# Patient Record
Sex: Female | Born: 1965 | Race: Black or African American | Hispanic: No | Marital: Single | Smoking: Never smoker
Health system: Southern US, Community
[De-identification: ages and names within clinical notes are randomized; demographics above are authoritative.]

## PROBLEM LIST (undated history)

## (undated) DIAGNOSIS — E119 Type 2 diabetes mellitus without complications: Secondary | ICD-10-CM

## (undated) DIAGNOSIS — G4730 Sleep apnea, unspecified: Secondary | ICD-10-CM

## (undated) DIAGNOSIS — J45909 Unspecified asthma, uncomplicated: Secondary | ICD-10-CM

## (undated) DIAGNOSIS — I10 Essential (primary) hypertension: Secondary | ICD-10-CM

## (undated) HISTORY — PX: PATELLA FRACTURE SURGERY: SHX735

## (undated) HISTORY — PX: OOPHORECTOMY: SHX86

## (undated) HISTORY — PX: ABDOMINAL HYSTERECTOMY: SHX81

## (undated) HISTORY — PX: KNEE ARTHROSCOPY: SUR90

---

## 2004-03-25 ENCOUNTER — Other Ambulatory Visit: Payer: Self-pay

## 2004-07-23 ENCOUNTER — Ambulatory Visit: Payer: Self-pay

## 2006-11-04 ENCOUNTER — Ambulatory Visit: Payer: Self-pay

## 2007-05-21 ENCOUNTER — Ambulatory Visit: Payer: Self-pay | Admitting: Cardiovascular Disease

## 2007-05-22 ENCOUNTER — Emergency Department: Payer: Self-pay | Admitting: Emergency Medicine

## 2007-07-18 ENCOUNTER — Emergency Department: Payer: Self-pay | Admitting: Emergency Medicine

## 2008-02-24 ENCOUNTER — Emergency Department: Payer: Self-pay | Admitting: Emergency Medicine

## 2008-03-09 ENCOUNTER — Emergency Department: Payer: Self-pay | Admitting: Emergency Medicine

## 2008-06-07 ENCOUNTER — Ambulatory Visit: Payer: Self-pay | Admitting: Obstetrics and Gynecology

## 2008-07-05 ENCOUNTER — Ambulatory Visit: Payer: Self-pay | Admitting: Obstetrics and Gynecology

## 2008-07-18 ENCOUNTER — Inpatient Hospital Stay: Payer: Self-pay | Admitting: Obstetrics and Gynecology

## 2008-07-22 ENCOUNTER — Emergency Department: Payer: Self-pay | Admitting: Emergency Medicine

## 2008-12-23 ENCOUNTER — Emergency Department: Payer: Self-pay | Admitting: Emergency Medicine

## 2009-06-27 ENCOUNTER — Emergency Department: Payer: Self-pay | Admitting: Unknown Physician Specialty

## 2009-09-01 ENCOUNTER — Inpatient Hospital Stay: Payer: Self-pay | Admitting: Student

## 2009-09-19 ENCOUNTER — Inpatient Hospital Stay: Payer: Self-pay | Admitting: Internal Medicine

## 2009-09-28 ENCOUNTER — Inpatient Hospital Stay: Payer: Self-pay | Admitting: Internal Medicine

## 2009-10-30 ENCOUNTER — Ambulatory Visit: Payer: Self-pay | Admitting: Specialist

## 2009-11-10 ENCOUNTER — Ambulatory Visit: Payer: Self-pay | Admitting: Specialist

## 2009-12-11 ENCOUNTER — Inpatient Hospital Stay: Payer: Self-pay | Admitting: Internal Medicine

## 2010-01-05 ENCOUNTER — Emergency Department: Payer: Self-pay | Admitting: Emergency Medicine

## 2010-03-28 ENCOUNTER — Emergency Department: Payer: Self-pay | Admitting: Emergency Medicine

## 2010-06-21 ENCOUNTER — Ambulatory Visit: Payer: Self-pay | Admitting: Orthopedic Surgery

## 2010-07-30 ENCOUNTER — Ambulatory Visit: Payer: Self-pay | Admitting: Orthopedic Surgery

## 2010-08-07 ENCOUNTER — Ambulatory Visit: Payer: Self-pay | Admitting: Orthopedic Surgery

## 2011-03-28 ENCOUNTER — Ambulatory Visit: Payer: Self-pay | Admitting: Family Medicine

## 2011-06-20 ENCOUNTER — Emergency Department: Payer: Self-pay | Admitting: *Deleted

## 2011-06-28 ENCOUNTER — Emergency Department: Payer: Self-pay | Admitting: Emergency Medicine

## 2011-10-14 IMAGING — CR DG CHEST 1V PORT
1 series · 1 of 1 positions shown · non-contrast
Comparison: none

REASON FOR EXAM: Chest Pain
COMMENTS:

PROCEDURE:     DXR - DXR PORTABLE CHEST SINGLE VIEW  - December 10, 2009 [DATE]
RESULT:     There are patchy pulmonary infiltrative changes bilaterally.
Pneumonia would be the first consideration as to etiology. No pleural
effusion is seen. The heart size is within normal limits.

[view not recorded]
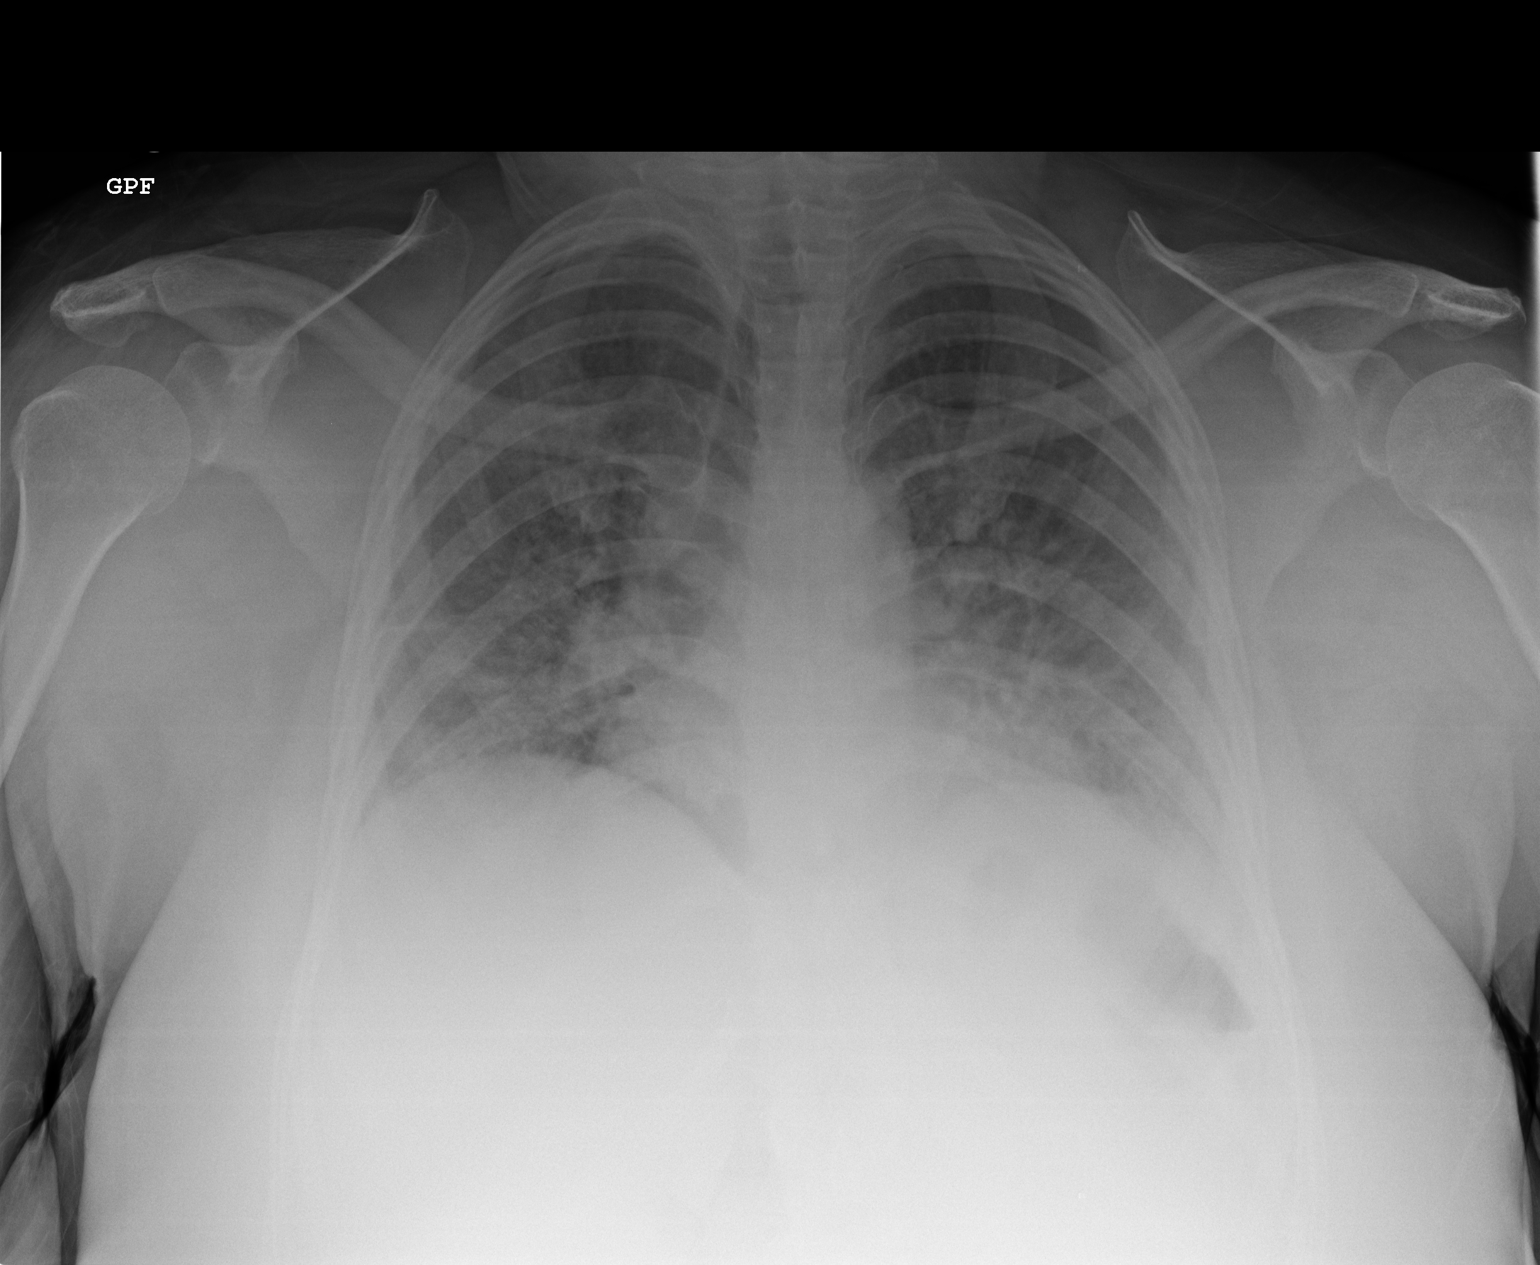

[1 of 1 positions shown; findings below may reference images not displayed]

IMPRESSION: There are patchy interstitial and alveolar infiltrative
changes bilaterally. Pneumonia would be the first consideration although
edema could produce similar findings. Continued follow-up is recommended.

## 2011-10-17 IMAGING — US US THYROID
1 series · 14 of 14 positions shown · non-contrast
Comparison: none

REASON FOR EXAM: goiter, nodule on ct
COMMENTS:

[Series 1: us thyroid · 14 of 14 slices shown]
[im 1/14]
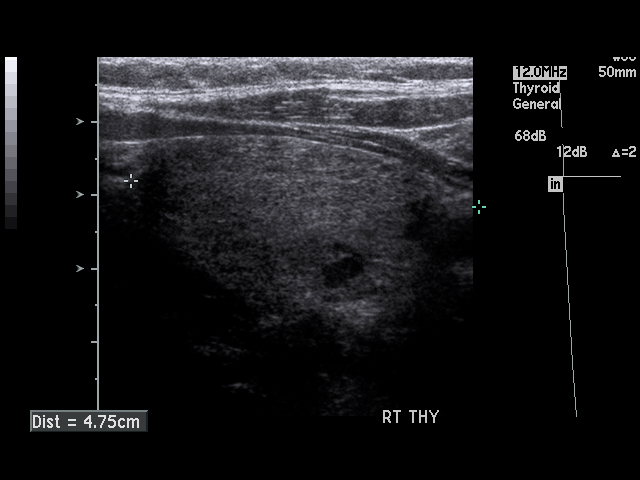
[im 2/14]
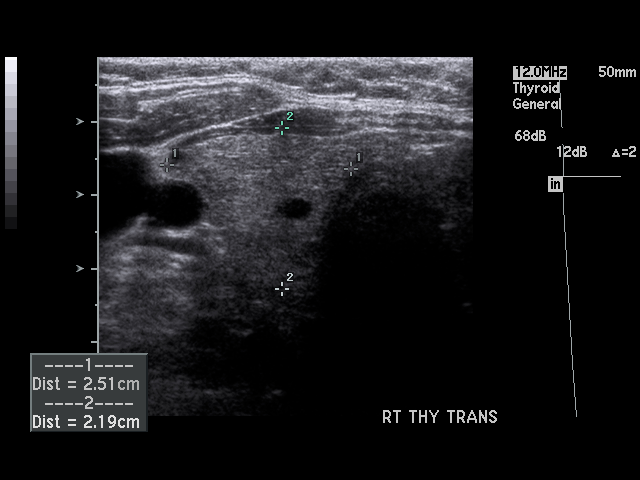
[im 3/14]
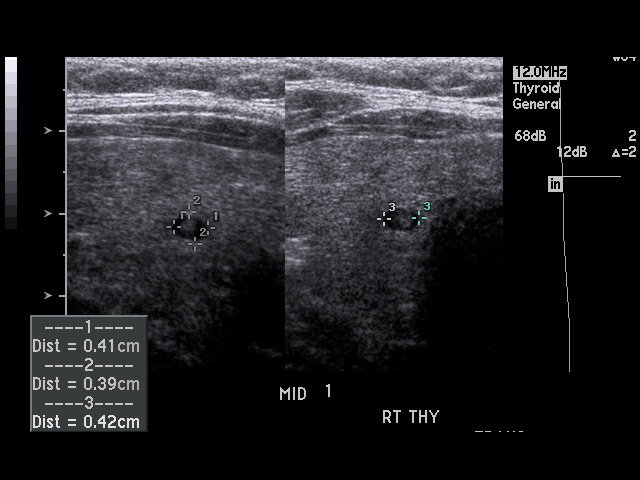
[im 4/14]
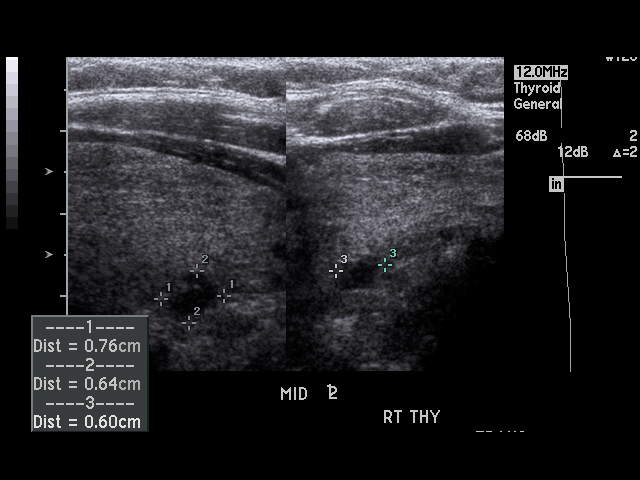
[im 5/14]
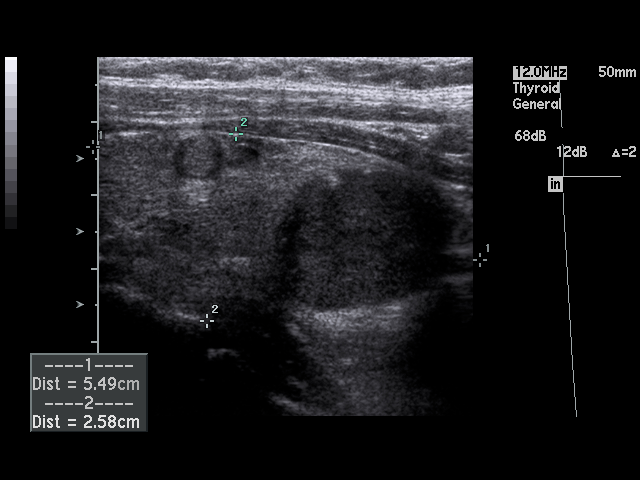
[im 6/14]
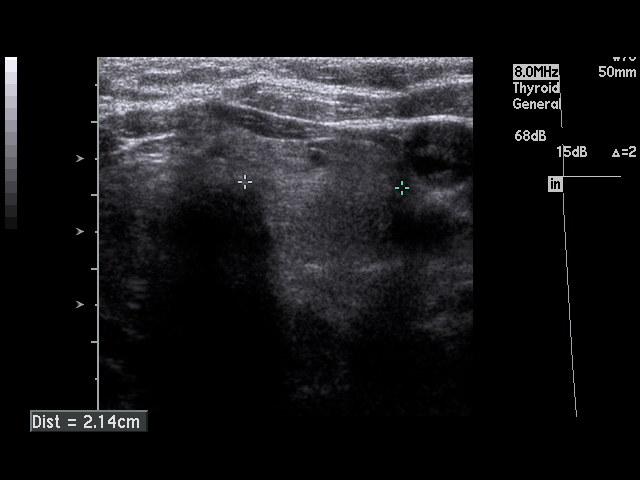
[im 7/14]
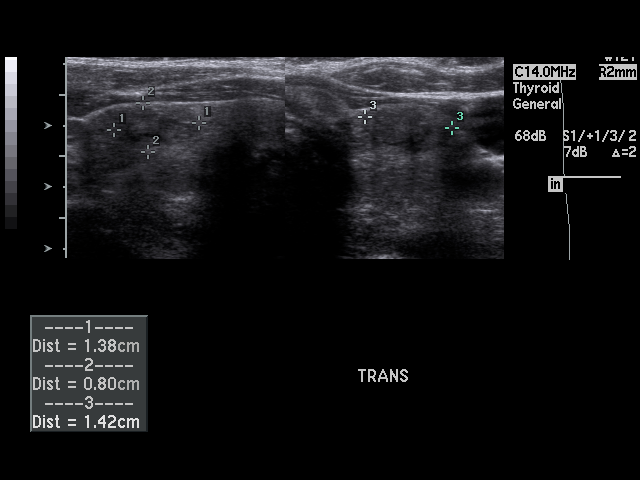
[im 8/14]
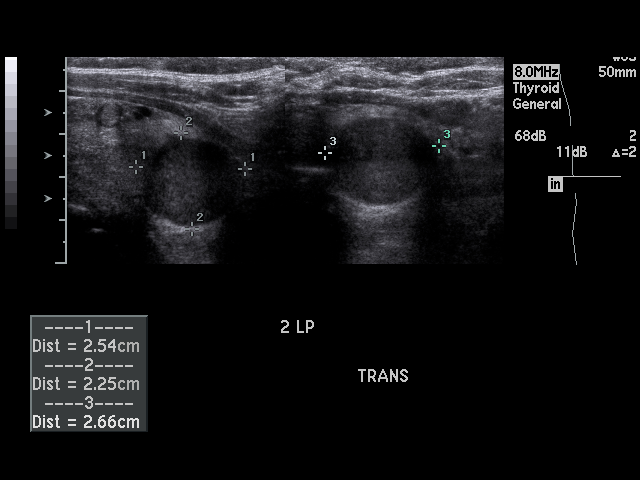
[im 9/14]
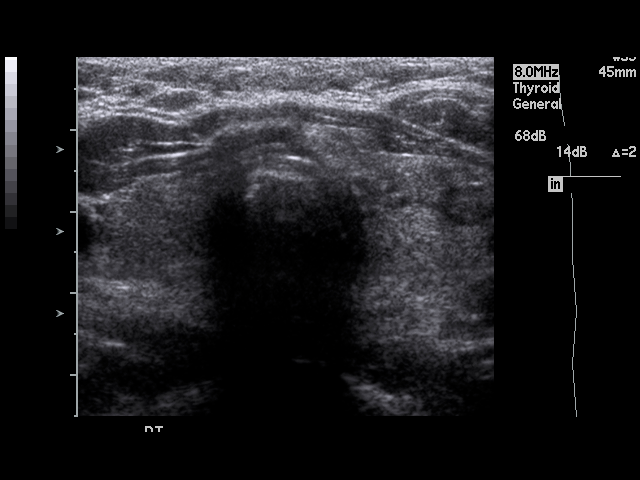
[im 10/14]
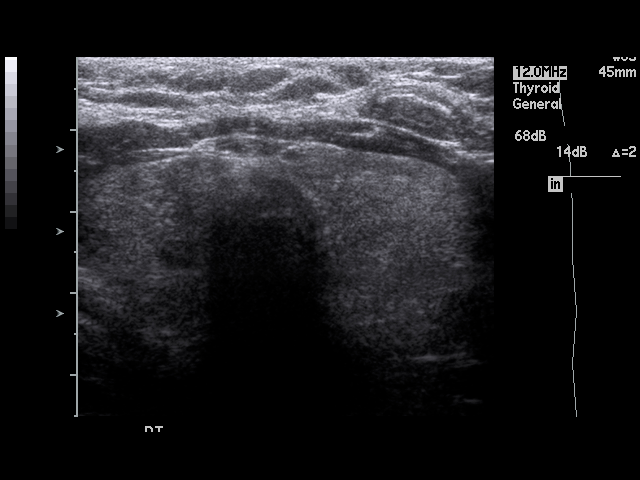
[im 11/14]
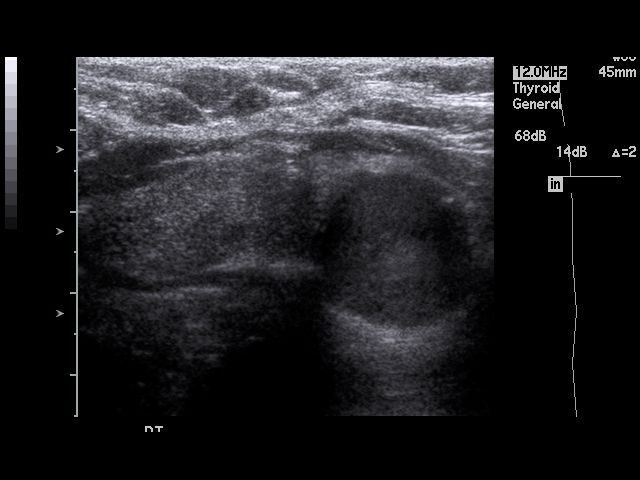
[im 12/14]
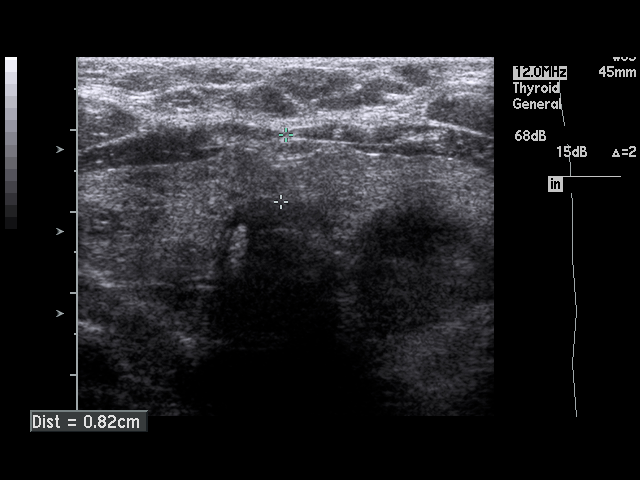
[im 13/14]
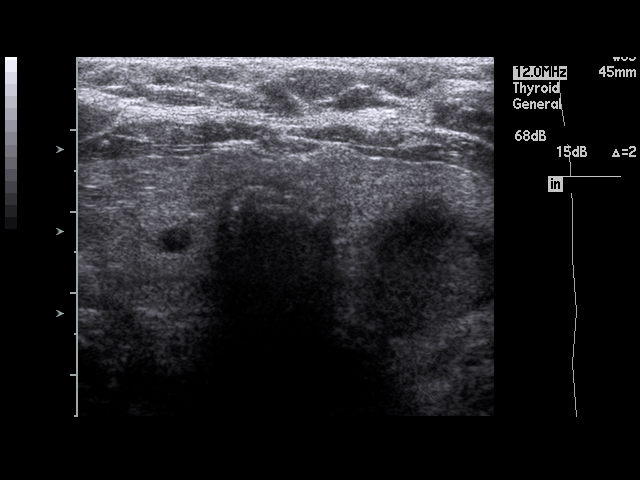
[im 14/14]
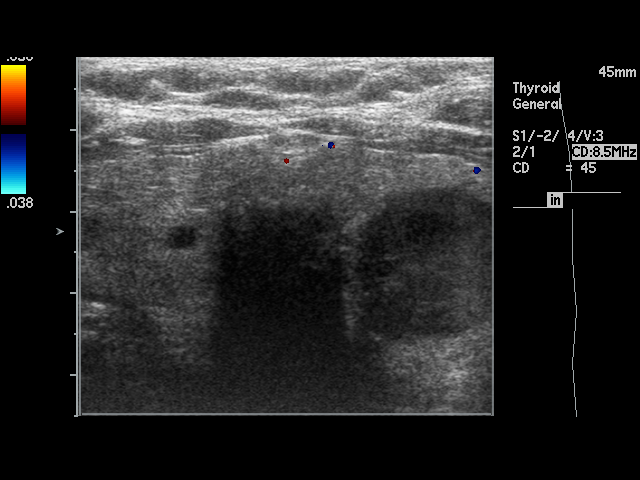

[14 of 14 positions shown; findings below may reference images not displayed]

PROCEDURE:     US  - US THYROID  - December 13, 2009  [DATE]

RESULT:     The right lobe of the thyroid measures 4.75 cm x 2.5 cm x
cm and the left lobe measures 5.49 cm x 2.1 cm x 2.58 cm. In the right lobe,
there are noted two hypoechoic nodules with the smaller being at the lower
pole and measuring 7.6 mm at maximum diameter and the larger being in the
midpole and measuring 4.2 mm at maximum diameter. On the left, there is a
1.42 cm hypoechoic nodule at the upper pole and a 2.66 cm hypoechoic nodule
at the lower pole. No calcifications are seen in association with the
nodules or in the thyroid lobes. The thyroid echotexture is mildly
heterogeneous bilaterally.
IMPRESSION: Multinodular thyroid consistent with multinodular goiter.

## 2013-04-20 ENCOUNTER — Emergency Department: Payer: Self-pay | Admitting: Emergency Medicine

## 2013-04-30 LAB — CBC WITH DIFFERENTIAL/PLATELET
Basophil #: 0.1 10*3/uL (ref 0.0–0.1)
HCT: 40.4 % (ref 35.0–47.0)
HGB: 13.6 g/dL (ref 12.0–16.0)
Lymphocyte %: 45.3 %
MCH: 27.8 pg (ref 26.0–34.0)
MCHC: 33.7 g/dL (ref 32.0–36.0)
Neutrophil #: 4.9 10*3/uL (ref 1.4–6.5)
Neutrophil %: 37.7 %
RBC: 4.9 10*6/uL (ref 3.80–5.20)
RDW: 13.6 % (ref 11.5–14.5)
WBC: 13.1 10*3/uL — ABNORMAL HIGH (ref 3.6–11.0)

## 2013-04-30 LAB — BASIC METABOLIC PANEL
Calcium, Total: 9.3 mg/dL (ref 8.5–10.1)
Co2: 29 mmol/L (ref 21–32)
Creatinine: 0.48 mg/dL — ABNORMAL LOW (ref 0.60–1.30)
EGFR (African American): >60
Glucose: 172 mg/dL — ABNORMAL HIGH (ref 65–99)
Potassium: 3.3 mmol/L — ABNORMAL LOW (ref 3.5–5.1)

## 2013-05-01 ENCOUNTER — Observation Stay: Payer: Self-pay | Admitting: Internal Medicine

## 2013-05-01 LAB — POTASSIUM: Potassium: 4.2 mmol/L (ref 3.5–5.1)

## 2013-05-05 LAB — CULTURE, BLOOD (SINGLE)

## 2013-11-04 ENCOUNTER — Emergency Department: Payer: Self-pay | Admitting: Emergency Medicine

## 2013-12-16 ENCOUNTER — Emergency Department: Payer: Self-pay | Admitting: Emergency Medicine

## 2013-12-16 LAB — COMPREHENSIVE METABOLIC PANEL
ALK PHOS: 117 U/L
ALT: 19 U/L (ref 12–78)
Albumin: 3 g/dL — ABNORMAL LOW (ref 3.4–5.0)
Anion Gap: 6 — ABNORMAL LOW (ref 7–16)
BUN: 8 mg/dL (ref 7–18)
Bilirubin,Total: 0.2 mg/dL (ref 0.2–1.0)
Calcium, Total: 8.4 mg/dL — ABNORMAL LOW (ref 8.5–10.1)
Chloride: 100 mmol/L (ref 98–107)
Co2: 28 mmol/L (ref 21–32)
Creatinine: 0.58 mg/dL — ABNORMAL LOW (ref 0.60–1.30)
EGFR (African American): >60
Glucose: 336 mg/dL — ABNORMAL HIGH (ref 65–99)
Osmolality: 280 (ref 275–301)
Potassium: 3.6 mmol/L (ref 3.5–5.1)
SGOT(AST): 13 U/L — ABNORMAL LOW (ref 15–37)
SODIUM: 134 mmol/L — AB (ref 136–145)
TOTAL PROTEIN: 7.3 g/dL (ref 6.4–8.2)

## 2013-12-16 LAB — CBC
HCT: 43.1 % (ref 35.0–47.0)
HGB: 14.1 g/dL (ref 12.0–16.0)
MCH: 28 pg (ref 26.0–34.0)
MCHC: 32.7 g/dL (ref 32.0–36.0)
MCV: 86 fL (ref 80–100)
Platelet: 240 10*3/uL (ref 150–440)
RBC: 5.04 10*6/uL (ref 3.80–5.20)
RDW: 13.5 % (ref 11.5–14.5)
WBC: 8.6 10*3/uL (ref 3.6–11.0)

## 2013-12-16 LAB — TROPONIN I

## 2013-12-16 LAB — PRO B NATRIURETIC PEPTIDE: B-Type Natriuretic Peptide: 11 pg/mL (ref 0–125)

## 2013-12-16 LAB — LIPASE, BLOOD: LIPASE: 88 U/L (ref 73–393)

## 2014-12-09 NOTE — H&P (Signed)
PATIENT NAME:  Cynthia Howe, Cynthia Howe MR#:  [id number] DATE OF BIRTH:  [birth year]  DATE OF ADMISSION:  04/30/2013  REFERRING PHYSICIAN:  Dr. Fanny Bien.  PRIMARY CARE PHYSICIAN:  Dr. Angus Palms.  HISTORY OF PRESENT ILLNESS:  This is a 49 year old African American female with history of diabetes, hypertension, pulmonary hypertension, as well as asthma.  She is a nonsmoker.  She is presenting with shortness of breath and cough that has been progressive over the last three weeks and worsening today to the point where she said she could not catch her breath.  She has been without her Symbicort medication for approximately two weeks in total secondary to insurance issues.  Once again, she has had shortness of breath, described as difficulty getting air with associated wheezing, productive cough of clear sputum.  All of these symptoms have been present for a total of three weeks, though worsening to the point of Emergency Department presentation today.  She was actually seen on September 3rd at [facility] Emergency Department for similar symptoms.  She was given Augmentin at that time without any resolution of her symptoms, so she presents once again today.  She denies any fevers or chills.  In the Emergency Department she was given steroids, azithromycin and ceftriaxone as well as breathing treatments with improvement of her symptoms.  Currently she is without complaint.   REVIEW OF SYSTEMS: CONSTITUTIONAL:  Denies any fevers, fatigue.  EYES:  Denies any blurred vision or eye pain.  EARS, NOSE, THROAT:  Denies any hearing loss or dysphagia, postnasal drip.  RESPIRATORY:  Cough as above, wheeze as above, shortness of breath as above.  CARDIOVASCULAR:  Denies chest pain, palpitations.  GASTROINTESTINAL:  Denies nausea, vomiting, diarrhea.  GENITOURINARY:  Denies dysuria, hematuria.  ENDOCRINE:  Denies nocturia or thyroid problems.  HEMATOLOGIC AND LYMPHATIC:  Denies any anemia or easy bruising.  SKIN:   Denies rash or lesion.  MUSCULOSKELETAL:  Denies neck or back pain.   NEUROLOGIC:  Denies any paralysis or paresthesias.  PSYCHIATRIC:  Denies anxiety or depressive symptoms.   PAST MEDICAL HISTORY:  Hypertension, pulmonary hypertension, type 2 diabetes which is insulin requiring, as well as asthma.   SOCIAL HISTORY:  She is a nonsmoker.  Denies alcohol or drug usage.  She works at [employer]. where she has occupational exposures of dust and fumes which worsened her symptoms of shortness of breath, cough and wheezing.   FAMILY HISTORY:  Significant for diabetes as well as hypertension.   ALLERGIES:  CIPRO AND SEPTRA.   HOME MEDICATIONS:  Include albuterol 90 mcg inhalation 2 puffs 4 times daily as needed, Norvasc 5 mg by mouth daily, aspirin 81 mg by mouth daily, glipizide 10 mg by mouth daily, Lantus 20 units subcutaneous injection daily, lisinopril 40 mg by mouth daily, NovoLog 4 units subcutaneous three times daily, Symbicort 160/4.5 mcg inhalation 2 puffs 2 times daily.   PHYSICAL EXAMINATION: VITAL SIGNS:  Temperature 98.4, heart rate 98, respirations 24, blood pressure 140/67, saturating 94% on room air, BMI 37.6.  GENERAL:  No acute distress, awake, and oriented x 3.  HEENT:  Normocephalic, atraumatic.  Extraocular muscles intact.  Pupils equal, round, reactive to light as well as accommodation.  Moist mucosal membranes.  CARDIOVASCULAR:  S1 and S2, regular rate and rhythm.  No murmurs, rubs or gallops.  PULMONARY:  Clear to auscultation bilaterally with decreased breath sounds throughout.  No wheeze rhonchi or crackles.  ABDOMEN:  Soft, nontender, nondistended, obese, positive bowel sounds.  EXTREMITIES:  Reveal no cyanosis, edema or clubbing.  NEUROLOGIC:  Cranial nerves II through XII intact.  No gross focal neurological deficit.   LABORATORY DATA:  Sodium 137, potassium 3.3, chloride 101, bicarb 29, BUN 9, creatinine 0.48, glucose 172.  WBC 13.1, which is lymphocytic in nature 5.9  as well as increased eosinophils of 1.5, hemoglobin 13.6, platelets 263.  Chest x-ray, no acute cardiopulmonary process.  EKG normal sinus rhythm, heart rate 86.   ASSESSMENT AND PLAN:  This is a 49 year old African American female with history of hypertension, diabetes, pulmonary hypertension as well as asthma.  She is a nonsmoker presenting with three week duration of shortness of breath and cough productive of clear sputum, which is minimal.  Her symptoms have been progressively worsening over the last three weeks to the point today where she stated she could not catch her breath.  She is of note out of Symbicort for approximately two weeks secondary to insurance issues.   1.  Asthma exacerbation.  She does not carry a diagnosis of chronic obstructive pulmonary disease.  She is a nonsmoker.  She does however have occupational exposures and likely reactive airways.  She has had some improvement in her symptoms, but is still decreased air entry.  Continue albuterol treatments scheduled q. 4 hours while awake as well as steroids.  She has no indications for antibiotics at this time. 2.  Type 2 diabetes, insulin requiring, continue Lantus, NovoLog, add insulin sliding scale.  Hold by mouth agents.  3.  Hypertension, Norvasc and lisinopril.  4.  Pulmonary hypertension.  She will need an outpatient sleep study if not previously performed.  5.  She is FULL CODE.   Time:  33 minutes.    ____________________________ Cletis Athensavid K. Hower, MD dkh:ea D: 05/01/2013 00:30:05 ET T: 05/01/2013 01:08:15 ET JOB#: [id number]  cc: Cletis Athensavid K. Hower, MD, <Dictator> DAVID Synetta ShadowK HOWER MD ELECTRONICALLY SIGNED 05/01/2013 21:18

## 2014-12-09 NOTE — Discharge Summary (Signed)
PATIENT NAME:  Cynthia PereyraHOLMAN, Amariah D MR#:  [id number] DATE OF BIRTH:  [birth year]  DATE OF ADMISSION:  05/01/2013 DATE OF DISCHARGE:  05/01/2013  PRIMARY CARE PHYSICIAN:  Dr. Angus PalmsGeorge Sionne.  DISCHARGE DIAGNOSES:  1.  Asthma exacerbation. 2.  Hypertension.  3.  Diabetes.  4.  Pulmonary hypertension.  5.  Obesity.   CODE STATUS:  FULL CODE.   CONDITION:  Stable.   HOME MEDICATIONS: 1.  Glipizide 10 mg by mouth daily.  2.  Norvasc 5 mg by mouth daily.  3.  Aspirin 81 mg by mouth daily.  4.  Lisinopril 40 mg by mouth daily. 5.  Lantus 20 units subQ once a day.  6.  NovoLog 4 units subQ three times daily.  7.  Symbicort 160 mcg/4.5 mcg inhalation aerosol 2 puffs twice daily.  8.  Albuterol CFC free 90 mcg inhalation aerosol 2 puffs 4 times a day as needed for shortness of breath.  9.  Guaifenesin 5 mL every four hours as needed for cough.   DIET:  Low-sodium, low-fat, low-cholesterol ADA diet.   ACTIVITY:  As tolerated.   FOLLOW-UP CARE:  Follow up with PCP within 1 to 2 weeks.  The patient also [facility]   REASON FOR ADMISSION:  Shortness of breath, cough, 3 weeks, worsening one day.   HOSPITAL COURSE:   1.  A 49 year old [place] female with a history of hypertension, diabetes, pulmonary hypertension and asthma that presented to the ED with cough and sputum for three weeks, worsening one day.  The patient could not get Symbicort due to insurance authorization.  For a detailed history and physical examination, please refer to the admission note dictated by Dr. Clint GuyHower.  On admission date, the patient's laboratory data is the following:  Sodium 137, potassium 3.3, chloride 101, bicarb 29, BUN 9, creatinine 0.48, WBC 13.1.  The patient was admitted for an asthma exacerbation.  After admission, the patient has been treated with Symbicort and albuterol.  In addition, the patient was treated with cough medication.  The  patient's symptoms has much improved after admission, she has only mild cough without shortness of breath.  She was given Symbicort going home.  2.  For diabetes, the patient has been treated with sliding scale and Lantus.  Blood sugar has been stable.    The patient is clinically stable, will be discharged home today.  I discussed the patient's discharge plan with the patient and nurse.   TIME SPENT:  About 32 minutes.    ____________________________ Shaune PollackQing Elesha Thedford, MD qc:ea D: 05/01/2013 14:12:13 ET T: 05/01/2013 19:16:38 ET JOB#: [id number]  cc: Shaune PollackQing Verina Galeno, MD, <Dictator> Shaune PollackQING Celise Bazar MD ELECTRONICALLY SIGNED 05/02/2013 15:36

## 2015-01-30 ENCOUNTER — Encounter: Payer: Self-pay | Admitting: Emergency Medicine

## 2015-01-30 ENCOUNTER — Emergency Department: Payer: 59

## 2015-01-30 ENCOUNTER — Observation Stay
Admission: EM | Admit: 2015-01-30 | Discharge: 2015-02-01 | Disposition: A | Discharge status: Home / Self Care | Payer: 59 | Attending: Internal Medicine | Admitting: Internal Medicine

## 2015-01-30 DIAGNOSIS — Z794 Long term (current) use of insulin: Secondary | ICD-10-CM | POA: Diagnosis not present

## 2015-01-30 DIAGNOSIS — Z9989 Dependence on other enabling machines and devices: Secondary | ICD-10-CM

## 2015-01-30 DIAGNOSIS — R0602 Shortness of breath: Secondary | ICD-10-CM | POA: Diagnosis not present

## 2015-01-30 DIAGNOSIS — R0902 Hypoxemia: Secondary | ICD-10-CM | POA: Insufficient documentation

## 2015-01-30 DIAGNOSIS — Z881 Allergy status to other antibiotic agents status: Secondary | ICD-10-CM | POA: Diagnosis not present

## 2015-01-30 DIAGNOSIS — Z79899 Other long term (current) drug therapy: Secondary | ICD-10-CM | POA: Insufficient documentation

## 2015-01-30 DIAGNOSIS — Z7951 Long term (current) use of inhaled steroids: Secondary | ICD-10-CM | POA: Diagnosis not present

## 2015-01-30 DIAGNOSIS — J9600 Acute respiratory failure, unspecified whether with hypoxia or hypercapnia: Secondary | ICD-10-CM | POA: Diagnosis not present

## 2015-01-30 DIAGNOSIS — Z8249 Family history of ischemic heart disease and other diseases of the circulatory system: Secondary | ICD-10-CM | POA: Diagnosis not present

## 2015-01-30 DIAGNOSIS — G4733 Obstructive sleep apnea (adult) (pediatric): Secondary | ICD-10-CM | POA: Diagnosis present

## 2015-01-30 DIAGNOSIS — R51 Headache: Secondary | ICD-10-CM | POA: Diagnosis not present

## 2015-01-30 DIAGNOSIS — E119 Type 2 diabetes mellitus without complications: Secondary | ICD-10-CM

## 2015-01-30 DIAGNOSIS — J4541 Moderate persistent asthma with (acute) exacerbation: Secondary | ICD-10-CM | POA: Diagnosis not present

## 2015-01-30 DIAGNOSIS — R079 Chest pain, unspecified: Secondary | ICD-10-CM | POA: Diagnosis not present

## 2015-01-30 DIAGNOSIS — E785 Hyperlipidemia, unspecified: Secondary | ICD-10-CM | POA: Diagnosis not present

## 2015-01-30 DIAGNOSIS — K5900 Constipation, unspecified: Secondary | ICD-10-CM

## 2015-01-30 DIAGNOSIS — T380X5A Adverse effect of glucocorticoids and synthetic analogues, initial encounter: Secondary | ICD-10-CM | POA: Insufficient documentation

## 2015-01-30 DIAGNOSIS — R519 Headache, unspecified: Secondary | ICD-10-CM

## 2015-01-30 DIAGNOSIS — J9601 Acute respiratory failure with hypoxia: Secondary | ICD-10-CM

## 2015-01-30 DIAGNOSIS — Z833 Family history of diabetes mellitus: Secondary | ICD-10-CM | POA: Insufficient documentation

## 2015-01-30 DIAGNOSIS — I272 Other secondary pulmonary hypertension: Secondary | ICD-10-CM | POA: Insufficient documentation

## 2015-01-30 DIAGNOSIS — I10 Essential (primary) hypertension: Secondary | ICD-10-CM | POA: Diagnosis not present

## 2015-01-30 DIAGNOSIS — Z809 Family history of malignant neoplasm, unspecified: Secondary | ICD-10-CM | POA: Diagnosis not present

## 2015-01-30 DIAGNOSIS — J45901 Unspecified asthma with (acute) exacerbation: Secondary | ICD-10-CM | POA: Diagnosis present

## 2015-01-30 HISTORY — DX: Unspecified asthma, uncomplicated: J45.909

## 2015-01-30 HISTORY — DX: Sleep apnea, unspecified: G47.30

## 2015-01-30 HISTORY — DX: Type 2 diabetes mellitus without complications: E11.9

## 2015-01-30 HISTORY — DX: Essential (primary) hypertension: I10

## 2015-01-30 LAB — CBC
HCT: 40.4 % (ref 35.0–47.0)
HEMOGLOBIN: 13.1 g/dL (ref 12.0–16.0)
MCH: 26.6 pg (ref 26.0–34.0)
MCHC: 32.4 g/dL (ref 32.0–36.0)
MCV: 82.3 fL (ref 80.0–100.0)
Platelets: 303 10*3/uL (ref 150–440)
RBC: 4.91 MIL/uL (ref 3.80–5.20)
RDW: 13.8 % (ref 11.5–14.5)
WBC: 11.9 10*3/uL — ABNORMAL HIGH (ref 3.6–11.0)

## 2015-01-30 LAB — COMPREHENSIVE METABOLIC PANEL
ALT: 19 U/L (ref 14–54)
ANION GAP: 9 (ref 5–15)
AST: 17 U/L (ref 15–41)
Albumin: 4 g/dL (ref 3.5–5.0)
Alkaline Phosphatase: 96 U/L (ref 38–126)
BUN: 9 mg/dL (ref 6–20)
CO2: 28 mmol/L (ref 22–32)
CREATININE: 0.51 mg/dL (ref 0.44–1.00)
Calcium: 9.3 mg/dL (ref 8.9–10.3)
Chloride: 100 mmol/L — ABNORMAL LOW (ref 101–111)
GFR calc Af Amer: >60 mL/min (ref >60)
GFR calc non Af Amer: >60 mL/min (ref >60)
Glucose, Bld: 119 mg/dL — ABNORMAL HIGH (ref 65–99)
Potassium: 3.6 mmol/L (ref 3.5–5.1)
Sodium: 137 mmol/L (ref 135–145)
Total Bilirubin: 0.2 mg/dL — ABNORMAL LOW (ref 0.3–1.2)
Total Protein: 8.2 g/dL — ABNORMAL HIGH (ref 6.5–8.1)

## 2015-01-30 LAB — TROPONIN I

## 2015-01-30 LAB — BRAIN NATRIURETIC PEPTIDE: B Natriuretic Peptide: 37 pg/mL (ref 0.0–100.0)

## 2015-01-30 MED ORDER — ASPIRIN 81 MG PO CHEW
81.0000 mg | CHEWABLE_TABLET | Freq: Every day | ORAL | Status: DC
  Administered 2015-01-31 – 2015-02-01 (×2): 81 mg via ORAL
  Filled 2015-01-30 (×2): qty 1

## 2015-01-30 MED ORDER — ALBUTEROL SULFATE (2.5 MG/3ML) 0.083% IN NEBU
INHALATION_SOLUTION | RESPIRATORY_TRACT | Status: AC
  Filled 2015-01-30: qty 9

## 2015-01-30 MED ORDER — ACETAMINOPHEN 325 MG PO TABS
650.0000 mg | ORAL_TABLET | Freq: Four times a day (QID) | ORAL | Status: DC | PRN
  Administered 2015-01-31 (×2): 650 mg via ORAL
  Filled 2015-01-30 (×2): qty 2

## 2015-01-30 MED ORDER — METHYLPREDNISOLONE SODIUM SUCC 125 MG IJ SOLR
125.0000 mg | Freq: Once | INTRAMUSCULAR | Status: AC
  Administered 2015-01-30: 125 mg via INTRAVENOUS

## 2015-01-30 MED ORDER — ATORVASTATIN CALCIUM 20 MG PO TABS
20.0000 mg | ORAL_TABLET | Freq: Every day | ORAL | Status: DC
  Administered 2015-01-31 – 2015-02-01 (×2): 20 mg via ORAL
  Filled 2015-01-30 (×2): qty 1

## 2015-01-30 MED ORDER — SODIUM CHLORIDE 0.9 % IJ SOLN
3.0000 mL | Freq: Two times a day (BID) | INTRAMUSCULAR | Status: DC
  Administered 2015-01-31 – 2015-02-01 (×4): 3 mL via INTRAVENOUS

## 2015-01-30 MED ORDER — ENOXAPARIN SODIUM 40 MG/0.4ML ~~LOC~~ SOLN
40.0000 mg | SUBCUTANEOUS | Status: DC
  Administered 2015-01-31 – 2015-02-01 (×2): 40 mg via SUBCUTANEOUS
  Filled 2015-01-30 (×2): qty 0.4

## 2015-01-30 MED ORDER — IPRATROPIUM-ALBUTEROL 0.5-2.5 (3) MG/3ML IN SOLN
3.0000 mL | Freq: Once | RESPIRATORY_TRACT | Status: AC
  Administered 2015-01-30: 3 mL via RESPIRATORY_TRACT

## 2015-01-30 MED ORDER — IPRATROPIUM-ALBUTEROL 0.5-2.5 (3) MG/3ML IN SOLN
RESPIRATORY_TRACT | Status: AC
  Filled 2015-01-30: qty 3

## 2015-01-30 MED ORDER — BUDESONIDE-FORMOTEROL FUMARATE 160-4.5 MCG/ACT IN AERO
2.0000 | INHALATION_SPRAY | Freq: Two times a day (BID) | RESPIRATORY_TRACT | Status: DC
  Administered 2015-01-31 – 2015-02-01 (×4): 2 via RESPIRATORY_TRACT
  Filled 2015-01-30: qty 6

## 2015-01-30 MED ORDER — ALBUTEROL SULFATE (2.5 MG/3ML) 0.083% IN NEBU
2.5000 mg | INHALATION_SOLUTION | RESPIRATORY_TRACT | Status: AC
  Administered 2015-01-30: 2.5 mg via RESPIRATORY_TRACT

## 2015-01-30 MED ORDER — METHYLPREDNISOLONE SODIUM SUCC 125 MG IJ SOLR
60.0000 mg | Freq: Four times a day (QID) | INTRAMUSCULAR | Status: DC
  Administered 2015-01-31 (×3): 60 mg via INTRAVENOUS
  Filled 2015-01-30 (×3): qty 2

## 2015-01-30 MED ORDER — ALBUTEROL SULFATE (2.5 MG/3ML) 0.083% IN NEBU
INHALATION_SOLUTION | RESPIRATORY_TRACT | Status: AC
  Administered 2015-01-30: 2.5 mg via RESPIRATORY_TRACT
  Filled 2015-01-30: qty 3

## 2015-01-30 MED ORDER — METHYLPREDNISOLONE SODIUM SUCC 125 MG IJ SOLR
INTRAMUSCULAR | Status: AC
  Filled 2015-01-30: qty 2

## 2015-01-30 MED ORDER — INSULIN ASPART 100 UNIT/ML ~~LOC~~ SOLN
0.0000 [IU] | Freq: Three times a day (TID) | SUBCUTANEOUS | Status: DC
  Administered 2015-01-31: 9 [IU] via SUBCUTANEOUS
  Filled 2015-01-30: qty 9

## 2015-01-30 MED ORDER — IPRATROPIUM-ALBUTEROL 0.5-2.5 (3) MG/3ML IN SOLN
3.0000 mL | RESPIRATORY_TRACT | Status: DC
  Administered 2015-01-31 (×5): 3 mL via RESPIRATORY_TRACT
  Filled 2015-01-30 (×5): qty 3

## 2015-01-30 MED ORDER — AMLODIPINE BESYLATE 10 MG PO TABS
10.0000 mg | ORAL_TABLET | Freq: Every day | ORAL | Status: DC
  Administered 2015-01-31 – 2015-02-01 (×2): 10 mg via ORAL
  Filled 2015-01-30 (×2): qty 1

## 2015-01-30 MED ORDER — INSULIN GLARGINE 100 UNIT/ML ~~LOC~~ SOLN
30.0000 [IU] | Freq: Every day | SUBCUTANEOUS | Status: DC
  Administered 2015-01-31: 30 [IU] via SUBCUTANEOUS
  Filled 2015-01-30 (×2): qty 0.3

## 2015-01-30 MED ORDER — ACETAMINOPHEN 650 MG RE SUPP: 650.0000 mg | Freq: Four times a day (QID) | RECTAL | Status: DC | PRN

## 2015-01-30 MED ORDER — LISINOPRIL 20 MG PO TABS
40.0000 mg | ORAL_TABLET | Freq: Every day | ORAL | Status: DC
  Administered 2015-01-31 – 2015-02-01 (×2): 40 mg via ORAL
  Filled 2015-01-30 (×2): qty 2

## 2015-01-30 MED ORDER — HYDROCODONE-ACETAMINOPHEN 5-325 MG PO TABS
1.0000 | ORAL_TABLET | Freq: Four times a day (QID) | ORAL | Status: DC | PRN
  Administered 2015-01-31 – 2015-02-01 (×3): 1 via ORAL
  Filled 2015-01-30 (×3): qty 1

## 2015-01-30 MED ORDER — IPRATROPIUM-ALBUTEROL 0.5-2.5 (3) MG/3ML IN SOLN
RESPIRATORY_TRACT | Status: AC
  Filled 2015-01-30: qty 6

## 2015-01-30 MED ORDER — SENNOSIDES-DOCUSATE SODIUM 8.6-50 MG PO TABS
1.0000 | ORAL_TABLET | Freq: Every evening | ORAL | Status: DC | PRN
  Administered 2015-01-31: 1 via ORAL
  Filled 2015-01-30: qty 1

## 2015-01-30 NOTE — ED Notes (Signed)
Sent in from next care with breathing diff./asthma

## 2015-01-30 NOTE — ED Notes (Signed)
Admitting physician at bedside.  Pt is 94% on RA.   States the doctor took the oxygen off to check her and although sats are ok, she is feeling SOB and breathing is a bit labored.

## 2015-01-30 NOTE — ED Provider Notes (Signed)
[facility] Emergency Department Provider Note  ____________________________________________  Time seen: Approximately 5:00 PM  I have reviewed the triage vital signs and the nursing notes.   HISTORY  Chief Complaint Respiratory Distress    HPI Cynthia Howe is a 49 y.o. female who has a history of asthma and pulmonary hypertension who presents today with increasing shortness of breath for the last 3 days. She reports wheezing, dry cough, and difficulty catching her breath despite using inhaler treatments at home. She states she does have asthma that reacts the same way off and on, and has not been hospitalized since 2014 when she had similar symptoms.  She denies chest pain though she does feel her lungs are slightly tight. She denies any fever or chills. She denies abdominal pain. She reports her symptoms are starting to improve note she's had inhalers in the ER. She has no history of any leg swelling, recent surgery, long travels, blood clots.     Past Medical History  Diagnosis Date  . Asthma   . Hypertension   . Diabetes mellitus without complication   . Sleep apnea     Patient Active Problem List   Diagnosis Date Noted  . Asthma with exacerbation 01/30/2015  . Diabetes type 2, controlled 01/30/2015  . HTN (hypertension) 01/30/2015  . HLD (hyperlipidemia) 01/30/2015  . OSA on CPAP 01/30/2015  . Asthma with acute exacerbation 01/30/2015    Past Surgical History  Procedure Laterality Date  . Knee arthroscopy    . Abdominal hysterectomy    . Oophorectomy    . Patella fracture surgery      Current Outpatient Rx  Name  Route  Sig  Dispense  Refill  . albuterol (PROVENTIL HFA;VENTOLIN HFA) 108 (90 BASE) MCG/ACT inhaler   Inhalation   Inhale 1 puff into the lungs every 6 (six) hours as needed for wheezing or shortness of breath.         Marland Kitchen amLODipine (NORVASC) 10 MG tablet   Oral   Take 10 mg by mouth daily.         Marland Kitchen aspirin EC  81 MG tablet   Oral   Take 81 mg by mouth daily.         . budesonide-formoterol (SYMBICORT) 160-4.5 MCG/ACT inhaler   Inhalation   Inhale 2 puffs into the lungs 2 (two) times daily.         . cyclobenzaprine (FLEXERIL) 10 MG tablet   Oral   Take 10 mg by mouth 2 (two) times daily as needed for muscle spasms.         Marland Kitchen glipiZIDE (GLUCOTROL XL) 10 MG 24 hr tablet   Oral   Take 10 mg by mouth daily.          Marland Kitchen HYDROcodone-acetaminophen (NORCO/VICODIN) 5-325 MG per tablet   Oral   Take 1 tablet by mouth every 6 (six) hours as needed for moderate pain.         Marland Kitchen insulin glargine (LANTUS) 100 UNIT/ML injection   Subcutaneous   Inject 30 Units into the skin daily.          Marland Kitchen ipratropium-albuterol (DUONEB) 0.5-2.5 (3) MG/3ML SOLN   Nebulization   Take 3 mLs by nebulization every 8 (eight) hours as needed (for shortness of breath).          Marland Kitchen lisinopril (PRINIVIL,ZESTRIL) 40 MG tablet   Oral   Take 40 mg by mouth daily.         Marland Kitchen  Phenylephrine-Acetaminophen (TYLENOL SINUS CONGESTION/PAIN PO)   Oral   Take 2 tablets by mouth 3 (three) times daily as needed (for sinus pressure).           Allergies Ciprofloxacin hcl and Septra  Family History  Problem Relation Age of Onset  . Diabetes Mother   . Cancer Mother   . Hypertension Father   . Diabetes Father   . Cancer Father   . Heart disease Father   . Heart disease Maternal Uncle   . Heart disease Paternal Grandfather   . Cancer Cousin     Social History History  Substance Use Topics  . Smoking status: Never Smoker   . Smokeless tobacco: Not on file  . Alcohol Use: No   pulmonary hypertension Diabetes Asthma  Review of Systems Constitutional: No fever/chills Eyes: No visual changes. ENT: No sore throat. Cardiovascular: Denies chest pain. Respiratory: See history of present illness Gastrointestinal: No abdominal pain.  No nausea, no vomiting.  No diarrhea.  No constipation. Genitourinary:  Negative for dysuria. Musculoskeletal: Negative for back pain. Skin: Negative for rash. Neurological: Negative for headaches, focal weakness or numbness.  10-point ROS otherwise negative.  ____________________________________________   PHYSICAL EXAM:  VITAL SIGNS: ED Triage Vitals  Enc Vitals Group     BP 01/30/15 1646 184/90 mmHg     Pulse Rate 01/30/15 1646 132     Resp 01/30/15 1646 24     Temp 01/30/15 1646 98 F (36.7 C)     Temp Source 01/30/15 1646 Oral     SpO2 01/30/15 1646 92 %     Weight 01/30/15 1646 230 lb (104.327 kg)     Height 01/30/15 1646  (1.651 m)     Head Cir --      Peak Flow --      Pain Score --      Pain Loc --      Pain Edu? --      Excl. in GC? --     Constitutional: Alert and oriented. Well appearing though having moderate increased work of breathing Eyes: Conjunctivae are normal. PERRL. EOMI. Head: Atraumatic. Nose: No congestion/rhinnorhea. Mouth/Throat: Mucous membranes are moist.  Oropharynx non-erythematous. Neck: No stridor.   Cardiovascular: Tachycardia at about 110 rate, regular rhythm. Grossly normal heart sounds.  Good peripheral circulation. Respiratory: She has moderate increased work of breathing with mild use of accessory muscles, interesting increased expiratory phase, wheezing throughout all lobes. She is not in acute extremities, but does exhibit increased work of breathing. Gastrointestinal: Soft and nontender. No distention. No abdominal bruits. No CVA tenderness. Musculoskeletal: No lower extremity tenderness nor edema.  No joint effusions. Neurologic:  Normal speech and language. No gross focal neurologic deficits are appreciated. Speech is normal.  Skin:  Skin is warm, dry and intact. No rash noted. Psychiatric: Mood and affect are normal. Speech and behavior are normal.  ____________________________________________   LABS (all labs ordered are listed, but only abnormal results are displayed)  Labs Reviewed   CBC - Abnormal; Notable for the following:    WBC 11.9 (*)    All other components within normal limits  COMPREHENSIVE METABOLIC PANEL - Abnormal; Notable for the following:    Chloride 100 (*)    Glucose, Bld 119 (*)    Total Protein 8.2 (*)    Total Bilirubin 0.2 (*)    All other components within normal limits  BRAIN NATRIURETIC PEPTIDE  TROPONIN I   ____________________________________________  EKG  ED ECG REPORT  I, Fanny Bien, MARK, the attending physician, personally viewed and interpreted this ECG.  Date: 01/30/2015 EKG Time: 1653 Rate: 120 Rhythm: Sinus tachycardia QRS Axis: normal Intervals: normal ST/T Wave abnormalities: normal Conduction Disutrbances: none Narrative Interpretation: Sinus tachycardia, possible LVH otherwise unremarkable  ____________________________________________  RADIOLOGY  DG Chest Port 1 View (Final result) Result time: 01/30/15 17:23:03   Final result by Rad Results In Interface (01/30/15 17:23:03)   Narrative:   CLINICAL DATA: Shortness of breath and chest pain today.  EXAM: PORTABLE CHEST - 1 VIEW  COMPARISON: PA and lateral chest 12/16/2013.  FINDINGS: The lungs are clear. Heart size is upper normal. No pneumothorax or pleural effusion. No focal bony abnormality.  IMPRESSION: No acute disease.    ____________________________________________   PROCEDURES  Procedure(s) performed: None  Critical Care performed: No  ____________________________________________   INITIAL IMPRESSION / ASSESSMENT AND PLAN / ED COURSE  Pertinent labs & imaging results that were available during my care of the patient were reviewed by me and considered in my medical decision making (see chart for details).  Patient presents with wheezing and dry cough without fever or chills. This is been going on for about 3 days, and based on examination and previous history this likely represents an acute asthma exacerbation. Her EKG demonstrates no  acute cardiac changes and does not appear ischemic. She has no risk factors for pulmonary embolism and no pleuritic chest pain. She does not appear to have fever, though we will check chest x-ray to evaluate and make sure that there is no consolidation. Her history does not support pneumonia at present.  We will treat the patient aggressively for moderate increased work of breathing. I will give her 3 stacked DuoNeb abs now, we will continue albuterol thereafter if still eating. I've given IV Solu-Medrol. Will monitor her closely in the emergency department for improvement.  ----------------------------------------- 9:43 PM on 01/30/2015 -----------------------------------------  Patient is noted to have O2 sats dropping to 92%, she reports she feels slightly increased work of breathing. She is slightly tachycardic. I'll give additional albuterol and at this point we'll admit to the hospital as she is speaking in short phrases and does have increased work of breathing with mild accessory muscle use still. ____________________________________________   FINAL CLINICAL IMPRESSION(S) / ED DIAGNOSES  Final diagnoses:  Asthma with exacerbation, moderate persistent      Sharyn Creamer, MD 01/30/15 2144

## 2015-01-30 NOTE — ED Notes (Signed)
Report received from Anna, RN.  Assumed care of patient at this time,  

## 2015-01-30 NOTE — H&P (Signed)
[facility] - [place] at [facility]   PATIENT NAME: Cynthia Howe    MR#:  [id number]  DATE OF BIRTH:  [birth year]  DATE OF ADMISSION:  01/30/2015  PRIMARY CARE PHYSICIAN: Pcp Not In System   REQUESTING/REFERRING PHYSICIAN: Quale  CHIEF COMPLAINT:   Chief Complaint  Patient presents with  . Respiratory Distress    HISTORY OF PRESENT ILLNESS:  Cynthia Howe  is a 49 y.o. female who presents with several days progressive shortness of breath. Patient has asthma at baseline, and has had to be admitted for exacerbations in the past. These admissions have been infrequent, and she denies ever having been intubated for her asthma. She has had 3-4 days of progressive shortness of breath, in conjunction with an increase in her allergy symptoms. Feels that that is likely the trigger of this exacerbation. Her shortness of breath got significantly worse today and she decided to come to the ED, where she was found on initial evaluation to be hypoxic. She was given treatment with nebulizers and Solu-Medrol and had some improvement in the ED, however her O2 sats were still in the high 80s to low 90s on room air, requiring oxygen to come up to normal level. Hospitalists were called for admission for acute asthma exacerbation.  PAST MEDICAL HISTORY:   Past Medical History  Diagnosis Date  . Asthma   . Hypertension   . Diabetes mellitus without complication   . Sleep apnea     PAST SURGICAL HISTORY:   Past Surgical History  Procedure Laterality Date  . Knee arthroscopy    . Abdominal hysterectomy    . Oophorectomy    . Patella fracture surgery      SOCIAL HISTORY:   History  Substance Use Topics  . Smoking status: Never Smoker   . Smokeless tobacco: Not on file  . Alcohol Use: No    FAMILY HISTORY:   Family History  Problem Relation Age of Onset  . Diabetes Mother   . Cancer Mother   . Hypertension Father   . Diabetes Father   . Cancer Father   .  Heart disease Father   . Heart disease Maternal Uncle   . Heart disease Paternal Grandfather   . Cancer Cousin     DRUG ALLERGIES:   Allergies  Allergen Reactions  . Ciprofloxacin Hcl Hives  . Septra [Sulfamethoxazole-Trimethoprim]     MEDICATIONS AT HOME:   Prior to Admission medications   Medication Sig Start Date End Date Taking? Authorizing Provider  albuterol (ACCUNEB) 1.25 MG/3ML nebulizer solution Take 1 ampule by nebulization every 4 (four) hours as needed for wheezing.   Yes Historical Provider, MD  albuterol (PROVENTIL HFA;VENTOLIN HFA) 108 (90 BASE) MCG/ACT inhaler Inhale 1 puff into the lungs every 4 (four) hours as needed for wheezing or shortness of breath.   Yes Historical Provider, MD  albuterol (PROVENTIL) (2.5 MG/3ML) 0.083% nebulizer solution Take 2.5 mg by nebulization every 4 (four) hours as needed for wheezing or shortness of breath.    Yes Historical Provider, MD  amLODipine (NORVASC) 10 MG tablet Take 10 mg by mouth daily.   Yes Historical Provider, MD  aspirin 81 MG tablet Take 81 mg by mouth daily.   Yes Historical Provider, MD  atorvastatin (LIPITOR) 20 MG tablet Take 20 mg by mouth daily.   Yes Historical Provider, MD  budesonide-formoterol (SYMBICORT) 160-4.5 MCG/ACT inhaler Inhale 2 puffs into the lungs 2 (two) times daily.   Yes Historical Provider, MD  cyclobenzaprine (FLEXERIL) 10 MG tablet Take 10 mg by mouth 2 (two) times daily as needed for muscle spasms.   Yes Historical Provider, MD  fexofenadine (ALLEGRA) 180 MG tablet Take 180 mg by mouth daily.   Yes Historical Provider, MD  glipiZIDE (GLUCOTROL XL) 10 MG 24 hr tablet Take 10 mg by mouth daily with breakfast.   Yes Historical Provider, MD  HYDROcodone-acetaminophen (NORCO/VICODIN) 5-325 MG per tablet Take 1 tablet by mouth every 6 (six) hours as needed for moderate pain.   Yes Historical Provider, MD  insulin glargine (LANTUS) 100 UNIT/ML injection Inject 30 Units into the skin at bedtime.   Yes  Historical Provider, MD  ipratropium-albuterol (DUONEB) 0.5-2.5 (3) MG/3ML SOLN Take 3 mLs by nebulization every 8 (eight) hours as needed.   Yes Historical Provider, MD  lisinopril (PRINIVIL,ZESTRIL) 40 MG tablet Take 40 mg by mouth daily.   Yes Historical Provider, MD    REVIEW OF SYSTEMS:  Review of Systems  Constitutional: Negative for fever, chills, weight loss and malaise/fatigue.  HENT: Negative for ear pain, hearing loss and tinnitus.   Eyes: Negative for blurred vision, double vision, pain and redness.  Respiratory: Positive for shortness of breath and wheezing. Negative for cough and hemoptysis.   Cardiovascular: Negative for chest pain, palpitations, orthopnea and leg swelling.  Gastrointestinal: Negative for nausea, vomiting, abdominal pain, diarrhea and constipation.  Genitourinary: Negative for dysuria, frequency and hematuria.  Musculoskeletal: Negative for back pain, joint pain and neck pain.  Skin:       No acne, rash, or lesions  Neurological: Negative for dizziness, tremors, focal weakness and weakness.  Endo/Heme/Allergies: Negative for polydipsia. Does not bruise/bleed easily.  Psychiatric/Behavioral: Negative for depression. The patient is not nervous/anxious and does not have insomnia.      VITAL SIGNS:   Filed Vitals:   01/30/15 1656 01/30/15 1707 01/30/15 1758 01/30/15 2055  BP: 152/84 138/89 137/73 137/83  Pulse:  101 84 106  Temp:    98.6 F (37 C)  TempSrc:    Oral  Resp: Height:      Weight:      SpO2:  100% 98% 94%   Wt Readings from Last 3 Encounters:  01/30/15 104.327 kg (230 lb)    PHYSICAL EXAMINATION:  Physical Exam  Constitutional: She is oriented to person, place, and time. She appears well-developed and well-nourished. No distress.  HENT:  Head: Normocephalic and atraumatic.  Mouth/Throat: Oropharynx is clear and moist.  Eyes: Conjunctivae and EOM are normal. Pupils are equal, round, and reactive to light. No scleral  icterus.  Neck: Normal range of motion. Neck supple. No JVD present. No thyromegaly present.  Cardiovascular: Normal rate, regular rhythm and intact distal pulses.  Exam reveals no gallop and no friction rub.   No murmur heard. Respiratory: No respiratory distress. She has no wheezes. She has no rales.  Breathing comfortably with O2 via nasal cannula, air moment still decreased in bilateral lungs, worst at the bases.  GI: Soft. Bowel sounds are normal. She exhibits no distension. There is no tenderness.  Genitourinary: Guaiac positive stool.  Musculoskeletal: Normal range of motion. She exhibits no edema.  No arthritis, no gout  Lymphadenopathy:    She has no cervical adenopathy.  Neurological: She is alert and oriented to person, place, and time. No cranial nerve deficit.  No dysarthria, no aphasia  Skin: Skin is warm and dry. No rash noted. No erythema.  Psychiatric: She has a normal mood  and affect. Her behavior is normal. Judgment and thought content normal.    LABORATORY PANEL:   CBC  Recent Labs Lab 01/30/15 1657  WBC 11.9*  HGB 13.1  HCT 40.4  PLT 303   ------------------------------------------------------------------------------------------------------------------  Chemistries   Recent Labs Lab 01/30/15 1657  NA 137  K 3.6  CL 100*  CO2 28  GLUCOSE 119*  BUN 9  CREATININE 0.51  CALCIUM 9.3  AST 17  ALT 19  ALKPHOS 96  BILITOT 0.2*   ------------------------------------------------------------------------------------------------------------------  Cardiac Enzymes  Recent Labs Lab 01/30/15 1657  TROPONINI <0.03   ------------------------------------------------------------------------------------------------------------------  RADIOLOGY:  Dg Chest Port 1 View  01/30/2015   CLINICAL DATA:  Shortness of breath and chest pain today.  EXAM: PORTABLE CHEST - 1 VIEW  COMPARISON:  PA and lateral chest 12/16/2013.  FINDINGS: The lungs are clear. Heart  size is upper normal. No pneumothorax or pleural effusion. No focal bony abnormality.  IMPRESSION: No acute disease.   Electronically Signed   By: Drusilla Kanner M.D.   On: 01/30/2015 17:23    EKG:   Orders placed or performed during the hospital encounter of 01/30/15  . EKG test  . EKG test    IMPRESSION AND PLAN:  Principal Problem:   Asthma with exacerbation - improved some with nebs and Solu-Medrol in the ED. Continue Solu-Medrol and scheduled nebs on the floor. Continue home inhalers. Continuous pulse ox monitoring for now. Active Problems:   Diabetes type 2, controlled - home dose basal insulin, hold home glipizide and add sliding scale insulin before meals at bedtime here. Carb controlled diet.   OSA on CPAP - ordered CPAP at home settings to be titrated by respiratory.   HTN (hypertension) - chronic stable, home meds.   HLD (hyperlipidemia) - chronic stable, home meds.  All the records are reviewed and case discussed with ED provider. Management plans discussed with the patient and/or family.  DVT PROPHYLAXIS: SubQ lovenox  ADMISSION STATUS: Observation  CODE STATUS: Full  TOTAL TIME TAKING CARE OF THIS PATIENT: 45 minutes.    Ankush Gintz FIELDING 01/30/2015, 9:03 PM  [facility]  Office  [phone]  CC: Primary care physician; Pcp Not In System

## 2015-01-30 NOTE — ED Notes (Signed)
Assisted patient to use the bathroom

## 2015-01-31 LAB — GLUCOSE, CAPILLARY
GLUCOSE-CAPILLARY: 394 mg/dL — AB (ref 65–99)
GLUCOSE-CAPILLARY: 403 mg/dL — AB (ref 65–99)
Glucose-Capillary: 366 mg/dL — ABNORMAL HIGH (ref 65–99)
Glucose-Capillary: 441 mg/dL — ABNORMAL HIGH (ref 65–99)
Glucose-Capillary: 388 mg/dL — ABNORMAL HIGH (ref 65–99)
Glucose-Capillary: 331 mg/dL — ABNORMAL HIGH (ref 65–99)
Glucose-Capillary: 340 mg/dL — ABNORMAL HIGH (ref 65–99)
Glucose-Capillary: 337 mg/dL — ABNORMAL HIGH (ref 65–99)

## 2015-01-31 LAB — BASIC METABOLIC PANEL
Anion gap: 8 (ref 5–15)
BUN: 11 mg/dL (ref 6–20)
CALCIUM: 9.4 mg/dL (ref 8.9–10.3)
CHLORIDE: 102 mmol/L (ref 101–111)
CO2: 26 mmol/L (ref 22–32)
Creatinine, Ser: 0.6 mg/dL (ref 0.44–1.00)
GFR calc Af Amer: >60 mL/min (ref >60)
GFR calc non Af Amer: >60 mL/min (ref >60)
GLUCOSE: 354 mg/dL — AB (ref 65–99)
POTASSIUM: 4 mmol/L (ref 3.5–5.1)
Sodium: 136 mmol/L (ref 135–145)

## 2015-01-31 LAB — CBC
HEMATOCRIT: 40.4 % (ref 35.0–47.0)
HEMOGLOBIN: 12.8 g/dL (ref 12.0–16.0)
MCH: 26.3 pg (ref 26.0–34.0)
MCHC: 31.5 g/dL — ABNORMAL LOW (ref 32.0–36.0)
MCV: 83.3 fL (ref 80.0–100.0)
Platelets: 293 10*3/uL (ref 150–440)
RBC: 4.86 MIL/uL (ref 3.80–5.20)
RDW: 14.2 % (ref 11.5–14.5)
WBC: 12.8 10*3/uL — ABNORMAL HIGH (ref 3.6–11.0)

## 2015-01-31 MED ORDER — METHYLPREDNISOLONE SODIUM SUCC 40 MG IJ SOLR
40.0000 mg | Freq: Three times a day (TID) | INTRAMUSCULAR | Status: DC
  Administered 2015-01-31 – 2015-02-01 (×3): 40 mg via INTRAVENOUS
  Filled 2015-01-31 (×3): qty 1

## 2015-01-31 MED ORDER — INSULIN GLARGINE 100 UNIT/ML ~~LOC~~ SOLN
45.0000 [IU] | Freq: Every day | SUBCUTANEOUS | Status: DC
  Administered 2015-01-31: 45 [IU] via SUBCUTANEOUS
  Filled 2015-01-31 (×2): qty 0.45

## 2015-01-31 MED ORDER — IPRATROPIUM-ALBUTEROL 0.5-2.5 (3) MG/3ML IN SOLN
3.0000 mL | Freq: Four times a day (QID) | RESPIRATORY_TRACT | Status: DC
  Administered 2015-02-01 (×2): 3 mL via RESPIRATORY_TRACT
  Filled 2015-01-31 (×3): qty 3

## 2015-01-31 MED ORDER — INSULIN ASPART 100 UNIT/ML ~~LOC~~ SOLN
0.0000 [IU] | Freq: Three times a day (TID) | SUBCUTANEOUS | Status: DC
  Filled 2015-01-31: qty 20

## 2015-01-31 MED ORDER — INSULIN ASPART 100 UNIT/ML ~~LOC~~ SOLN
0.0000 [IU] | SUBCUTANEOUS | Status: DC
  Administered 2015-01-31 (×2): 15 [IU] via SUBCUTANEOUS
  Administered 2015-02-01: 7 [IU] via SUBCUTANEOUS
  Administered 2015-02-01 (×2): 15 [IU] via SUBCUTANEOUS
  Administered 2015-02-01 (×2): 20 [IU] via SUBCUTANEOUS
  Filled 2015-01-31: qty 15
  Filled 2015-01-31 (×2): qty 20
  Filled 2015-01-31: qty 15
  Filled 2015-01-31: qty 7
  Filled 2015-01-31 (×2): qty 15

## 2015-01-31 MED ORDER — INSULIN ASPART 100 UNIT/ML ~~LOC~~ SOLN
20.0000 [IU] | Freq: Once | SUBCUTANEOUS | Status: AC
  Administered 2015-01-31: 20 [IU] via SUBCUTANEOUS

## 2015-01-31 MED ORDER — POLYETHYLENE GLYCOL 3350 17 G PO PACK
17.0000 g | PACK | Freq: Every day | ORAL | Status: DC
  Administered 2015-01-31 – 2015-02-01 (×2): 17 g via ORAL
  Filled 2015-01-31 (×2): qty 1

## 2015-01-31 MED ORDER — INSULIN ASPART 100 UNIT/ML ~~LOC~~ SOLN
9.0000 [IU] | Freq: Once | SUBCUTANEOUS | Status: AC
  Administered 2015-01-31: 9 [IU] via SUBCUTANEOUS
  Filled 2015-01-31: qty 9

## 2015-01-31 MED ORDER — DIPHENHYDRAMINE HCL 25 MG PO CAPS
25.0000 mg | ORAL_CAPSULE | Freq: Four times a day (QID) | ORAL | Status: DC | PRN
  Administered 2015-02-01 (×2): 25 mg via ORAL
  Filled 2015-01-31 (×2): qty 1

## 2015-01-31 NOTE — Plan of Care (Signed)
Problem: Discharge Progression Outcomes Goal: Discharge plan in place and appropriate Outcome: Progressing Pt is being weaned on the solumedrol dose and does not require any 02 at this time. Likely to discharge tomorrow if breathing and blood sugars are wnls for pt.

## 2015-01-31 NOTE — Plan of Care (Signed)
Problem: Discharge Progression Outcomes Goal: Other Discharge Outcomes/Goals Outcome: Progressing Pts blood sugars have increased since admission related to solumedrol. Dr notified and increased insulin before meals and started her on Lantus dosage which she takes at home Diabetes referral nurse reccommended a insulin drip, but Dr declined this at this time Monitor blood sugars before meals and PRN

## 2015-01-31 NOTE — Progress Notes (Signed)
Inpatient Diabetes Program Recommendations  AACE/ADA: New Consensus Statement on Inpatient Glycemic Control (2013)  Target Ranges:  Prepandial:   less than 140 mg/dL      Peak postprandial:   less than 180 mg/dL (1-2 hours)      Critically ill patients:  140 - 180 mg/dL   Results for ANTONIETA, PARAMO (MRN [id number]) as of 01/31/2015 11:32  Ref. Range 01/31/2015 00:05 01/31/2015 01:13 01/31/2015 07:26 01/31/2015 11:12  Glucose-Capillary Latest Ref Range: 65-99 mg/dL 975 (H) 883 (H) 254 (H) 441 (H)   Reason for Visit: elevated CBG  Diabetes history: Type 2 Outpatient Diabetes medications: Lantus 30 units q day, Glipizide 10mg /day Current orders for Inpatient glycemic control: Lantus insulin 30 units qday, Novolog correction insulin 0-20 tid with meals,   I have noted that the patient has a decrease dose of steroids ordered but given the high blood sugar numbers, please consider tranfering the patient to the ICU stepdown unit and starting the patient on IV insulin using the IV insulin Glucostabilizer orders.  If this is not deemed the 1st option, please consider increasing the Lantus to 45 units q day beginning this evening, increase Novolog correction to q4h (continue resistant scale at  0-20 units), and add mealtime insulin (Novolog 8 units tid with meals).  Do not restart the Glipizide while she remains an inpatient.   Susette Racer, RN, BA, MHA, CDE Diabetes Coordinator Inpatient Diabetes Program  [phone] (Team Pager) [phone] [facility] Office) 01/31/2015 11:38 AM

## 2015-01-31 NOTE — Progress Notes (Signed)
Patient ID: Cynthia Howe, female   DOB: January 25, 1966, 48 y.o.   MRN: [id number] [facility] PROGRESS NOTE HPI/Subjective: Patient feels her breathing is better but not quite back to her baseline. She short of breath. Some cough. Nonproductive. She states that when she gets angry she can get short of breath and that is what happened. She has some headache from when a chair fell on her head the other day.  Objective: Filed Vitals:   01/31/15 0728  BP: 132/63  Pulse: 90  Temp: 98.9 F (37.2 C)  Resp: 20    Intake/Output Summary (Last 24 hours) at 01/31/15 1035 Last data filed at 01/31/15 0900  Gross per 24 hour  Intake      0 ml  Output    700 ml  Net   -700 ml   Filed Weights   01/30/15 1646 01/30/15 2148  Weight: 104.327 kg (230 lb) 104.554 kg (230 lb 8 oz)    ROS: Review of Systems  Constitutional: Negative for fever and chills.  Eyes: Negative for blurred vision.  Respiratory: Positive for cough, shortness of breath and wheezing. Negative for sputum production.   Cardiovascular: Negative for chest pain.  Gastrointestinal: Positive for constipation. Negative for nausea, vomiting, abdominal pain and diarrhea.  Genitourinary: Negative for dysuria.  Musculoskeletal: Negative for joint pain.  Neurological: Positive for headaches. Negative for dizziness.   Exam: Physical Exam  HENT:  Nose: No mucosal edema.  Mouth/Throat: No oropharyngeal exudate or posterior oropharyngeal edema.  Eyes: Conjunctivae, EOM and lids are normal. Pupils are equal, round, and reactive to light.  Neck: No JVD present. Carotid bruit is not present. No edema present. No thyroid mass and no thyromegaly present.  Cardiovascular: Regular rhythm, S1 normal and S2 normal.  Tachycardia present.  Exam reveals no gallop.   No murmur heard. Pulses:      Dorsalis pedis pulses are 2+ on the right side, and 2+ on the left side.  Respiratory: No accessory muscle usage. No respiratory distress.  She has decreased breath sounds in the right middle field, the right lower field, the left middle field and the left lower field. She has wheezes in the right lower field and the left lower field. She has no rhonchi. She has no rales.  GI: Soft. Bowel sounds are normal. There is no tenderness.  Musculoskeletal:       Right ankle: She exhibits swelling.       Left ankle: She exhibits swelling.  Lymphadenopathy:    She has no cervical adenopathy.  Neurological: She is alert. No cranial nerve deficit.  Skin: Skin is warm. No rash noted. Nails show no clubbing.  Psychiatric: She has a normal mood and affect.   Data Reviewed: Basic Metabolic Panel:  Recent Labs Lab 01/30/15 1657 01/31/15 0531  NA 137 136  K 3.6 4.0  CL 100* 102  CO2 28 26  GLUCOSE 119* 354*  BUN 9 11  CREATININE 0.51 0.60  CALCIUM 9.3 9.4   Liver Function Tests:  Recent Labs Lab 01/30/15 1657  AST 17  ALT 19  ALKPHOS 96  BILITOT 0.2*  PROT 8.2*  ALBUMIN 4.0   CBC:  Recent Labs Lab 01/30/15 1657 01/31/15 0531  WBC 11.9* 12.8*  HGB 13.1 12.8  HCT 40.4 40.4  MCV 82.3 83.3  PLT 303 293   CBG:  Recent Labs Lab 01/31/15 0005 01/31/15 0113 01/31/15 0726  GLUCAP 403* 366* 394*   Studies: Dg Chest Port 1 [address]  01/30/2015   CLINICAL DATA:  Shortness of breath and chest pain today.  EXAM: PORTABLE CHEST - 1 VIEW  COMPARISON:  PA and lateral chest 12/16/2013.  FINDINGS: The lungs are clear. Heart size is upper normal. No pneumothorax or pleural effusion. No focal bony abnormality.  IMPRESSION: No acute disease.   Electronically Signed   By: Drusilla Kanner M.D.   On: 01/30/2015 17:23    Scheduled Meds: . amLODipine  10 mg Oral Daily  . aspirin  81 mg Oral Daily  . atorvastatin  20 mg Oral Daily  . budesonide-formoterol  2 puff Inhalation BID  . enoxaparin (LOVENOX) injection  40 mg Subcutaneous Q24H  . insulin aspart  0-20 Units Subcutaneous TID WC  . insulin glargine  30 Units Subcutaneous QHS   . ipratropium-albuterol  3 mL Nebulization Q4H  . lisinopril  40 mg Oral Daily  . methylPREDNISolone (SOLU-MEDROL) injection  40 mg Intravenous 3 times per day  . sodium chloride  3 mL Intravenous Q12H   Continuous Infusions:   Assessment/Plan:  1. Acute respiratory failure present on admission. This has resolved patient is now on room air saturating well. 2. Acute asthma exacerbation- decrease Solu-Medrol to 40 mg every 8 hours. Continue inhalers and nebulizer treatments. Potential discharge tomorrow depending on how her breathing is. 3. Type 2 diabetes mellitus- sugars are very high which is steroid-induced. Continue Lantus. Switched to high-dose sliding scale. Patient also takes glipizide at home will order that here. 4. Hypertension essential continue lisinopril and Norvasc. 5. Hyperlipidemia unspecified continue atorvastatin.  Code Status:     Code Status Orders        Start     Ordered   01/30/15 2307  Full code   Continuous     01/30/15 2306     Family Communication: Sister at bedside Disposition Plan: Hopefully home tomorrow.  Time spent: 25 minutes in coordination of care.  Alford Highland  [facility]

## 2015-02-01 DIAGNOSIS — K5900 Constipation, unspecified: Secondary | ICD-10-CM

## 2015-02-01 DIAGNOSIS — J9601 Acute respiratory failure with hypoxia: Secondary | ICD-10-CM

## 2015-02-01 DIAGNOSIS — R51 Headache: Secondary | ICD-10-CM

## 2015-02-01 DIAGNOSIS — R519 Headache, unspecified: Secondary | ICD-10-CM

## 2015-02-01 LAB — GLUCOSE, CAPILLARY
GLUCOSE-CAPILLARY: 325 mg/dL — AB (ref 65–99)
GLUCOSE-CAPILLARY: 355 mg/dL — AB (ref 65–99)
GLUCOSE-CAPILLARY: 356 mg/dL — AB (ref 65–99)
Glucose-Capillary: 209 mg/dL — ABNORMAL HIGH (ref 65–99)
Glucose-Capillary: 372 mg/dL — ABNORMAL HIGH (ref 65–99)
Glucose-Capillary: 342 mg/dL — ABNORMAL HIGH (ref 65–99)

## 2015-02-01 MED ORDER — ATORVASTATIN CALCIUM 20 MG PO TABS: 20.0000 mg | ORAL_TABLET | Freq: Every day | ORAL | Status: DC

## 2015-02-01 MED ORDER — SENNOSIDES-DOCUSATE SODIUM 8.6-50 MG PO TABS: 1.0000 | ORAL_TABLET | Freq: Every evening | ORAL | Status: AC | PRN

## 2015-02-01 MED ORDER — HYDROCODONE-ACETAMINOPHEN 5-325 MG PO TABS: 1.0000 | ORAL_TABLET | ORAL | Status: DC | PRN

## 2015-02-01 MED ORDER — INSULIN ASPART 100 UNIT/ML ~~LOC~~ SOLN: SUBCUTANEOUS | Status: AC

## 2015-02-01 MED ORDER — METHYLPREDNISOLONE 4 MG PO TBPK: ORAL_TABLET | ORAL | Status: DC

## 2015-02-01 MED ORDER — DM-GUAIFENESIN ER 30-600 MG PO TB12: 1.0000 | ORAL_TABLET | Freq: Two times a day (BID) | ORAL | Status: DC

## 2015-02-01 MED ORDER — AZITHROMYCIN 250 MG PO TABS: ORAL_TABLET | ORAL | Status: DC

## 2015-02-01 NOTE — Progress Notes (Addendum)
Called Dr. Anne Hahn @ [phone] and informed him that my patient needed something to relieve itching due to her diabetes.  Doctor ordered benadryl po.  Lennon Alstrom N  02/01/2015  1:17 AM

## 2015-02-01 NOTE — Discharge Summary (Signed)
[facility] - [place] at [facility]   PATIENT NAME: Cynthia Howe    MR#:  [id number]  DATE OF BIRTH:  03-30-1966  DATE OF ADMISSION:  01/30/2015 ADMITTING PHYSICIAN: Oralia Manis, MD  DATE OF DISCHARGE: No discharge date for patient encounter.  PRIMARY CARE PHYSICIAN: Pcp Not In System     ADMISSION DIAGNOSIS:  Asthma with exacerbation, moderate persistent [J45.41]  DISCHARGE DIAGNOSIS:  Principal Problem:   Asthma with exacerbation Active Problems:   Asthma with acute exacerbation   Acute respiratory failure with hypoxia   Diabetes type 2, controlled   OSA on CPAP   HTN (hypertension)   HLD (hyperlipidemia)   Headache   Constipation   SECONDARY DIAGNOSIS:   Past Medical History  Diagnosis Date  . Asthma   . Hypertension   . Diabetes mellitus without complication   . Sleep apnea     .pro HOSPITAL COURSE:   Cynthia Howe is a 49 y.o. female who presents with several days progressive shortness of breath. Patient has asthma at baseline, and has had to be admitted for exacerbations in the past. These admissions have been infrequent, and she denies ever having been intubated for her asthma. She has had 3-4 days of progressive shortness of breath, in conjunction with an increase in her allergy symptoms. Feels that that is likely the trigger of this exacerbation. Her shortness of breath got significantly worse today and she decided to come to the ED, where she was found on initial evaluation to be hypoxic. She was given treatment with nebulizers and Solu-Medrol and had some improvement in the ED, however her O2 sats were still in the high 80s to low 90s on room air, requiring oxygen to come up to normal level. Chest xray done in ER showed no pneumonia.  Patient was admitted, initiated on steroids, inhalation therapy, antibiotics and slowly improved.  Discussion by a  Problem: 1. Acute respiratory failure with hypoxia on admission. This has  resolved patient is now on room air saturating well. 2. Acute asthma exacerbation- taper steroids at home. Continue inhalers and nebulizer treatments. Discharge home today , Start Z- pack 3. Type 2 diabetes mellitus- sugars were very high which is steroid-induced. Continue Lantus. Switched to high-dose sliding scale. Patient also takes glipizide at home. Continue it. 4. Hypertension essential continue lisinopril and Norvasc. 5. Hyperlipidemia unspecified continue atorvastatin. 6. Headaches, likely due to cough will improve with improvement of patient's respiratory status.Added Humibid.  7.      Constipation start patient on senna and Colace DISCHARGE CONDITIONS:   stable  CONSULTS OBTAINED:  Treatment Team:  Katharina Caper, MD  DRUG ALLERGIES:   Allergies  Allergen Reactions  . Ciprofloxacin Hcl Hives  . Septra [Sulfamethoxazole-Trimethoprim] Hives    DISCHARGE MEDICATIONS:   Current Discharge Medication List    START taking these medications   Details  azithromycin (ZITHROMAX Z-PAK) 250 MG tablet Take as directed Qty: 6 each, Refills: 0    dextromethorphan-guaiFENesin (MUCINEX DM) 30-600 MG per 12 hr tablet Take 1 tablet by mouth 2 (two) times daily. Qty: 30 tablet, Refills: 0    methylPREDNISolone (MEDROL DOSEPAK) 4 MG TBPK tablet follow package directions Qty: 21 tablet, Refills: 0    senna-docusate (SENOKOT-S) 8.6-50 MG per tablet Take 1 tablet by mouth at bedtime as needed for mild constipation. Qty: 30 tablet, Refills: 0      CONTINUE these medications which have CHANGED   Details  HYDROcodone-acetaminophen (NORCO/VICODIN) 5-325 MG per tablet Take 1 tablet  by mouth every 4 (four) hours as needed for moderate pain. Qty: 15 tablet, Refills: 0      CONTINUE these medications which have NOT CHANGED   Details  albuterol (PROVENTIL HFA;VENTOLIN HFA) 108 (90 BASE) MCG/ACT inhaler Inhale 1 puff into the lungs every 6 (six) hours as needed for wheezing or shortness of  breath.    amLODipine (NORVASC) 10 MG tablet Take 10 mg by mouth daily.    aspirin EC 81 MG tablet Take 81 mg by mouth daily.    budesonide-formoterol (SYMBICORT) 160-4.5 MCG/ACT inhaler Inhale 2 puffs into the lungs 2 (two) times daily.    cyclobenzaprine (FLEXERIL) 10 MG tablet Take 10 mg by mouth 2 (two) times daily as needed for muscle spasms.    glipiZIDE (GLUCOTROL XL) 10 MG 24 hr tablet Take 10 mg by mouth daily.     insulin glargine (LANTUS) 100 UNIT/ML injection Inject 30 Units into the skin daily.     ipratropium-albuterol (DUONEB) 0.5-2.5 (3) MG/3ML SOLN Take 3 mLs by nebulization every 8 (eight) hours as needed (for shortness of breath).     lisinopril (PRINIVIL,ZESTRIL) 40 MG tablet Take 40 mg by mouth daily.    Phenylephrine-Acetaminophen (TYLENOL SINUS CONGESTION/PAIN PO) Take 2 tablets by mouth 3 (three) times daily as needed (for sinus pressure).      STOP taking these medications     aspirin 81 MG tablet      atorvastatin (LIPITOR) 20 MG tablet          DISCHARGE INSTRUCTIONS:   Follow up with PCP in 2-3 days  If you experience worsening of your admission symptoms, develop shortness of breath, life threatening emergency, suicidal or homicidal thoughts you must seek medical attention immediately by calling 911 or calling your MD immediately  if symptoms less severe.  You Must read complete instructions/literature along with all the possible adverse reactions/side effects for all the Medicines you take and that have been prescribed to you. Take any new Medicines after you have completely understood and accept all the possible adverse reactions/side effects.   Please note  You were cared for by a hospitalist during your hospital stay. If you have any questions about your discharge medications or the care you received while you were in the hospital after you are discharged, you can call the unit and asked to speak with the hospitalist on call if the hospitalist  that took care of you is not available. Once you are discharged, your primary care physician will handle any further medical issues. Please note that NO REFILLS for any discharge medications will be authorized once you are discharged, as it is imperative that you return to your primary care physician (or establish a relationship with a primary care physician if you do not have one) for your aftercare needs so that they can reassess your need for medications and monitor your lab values.    Today   CHIEF COMPLAINT:   Chief Complaint  Patient presents with  . Respiratory Distress    HISTORY OF PRESENT ILLNESS:  Cynthia Howe  is a 49 y.o. female with a known history of asthma who presents with several days progressive shortness of breath. Patient has asthma at baseline, and has had to be admitted for exacerbations in the past. These admissions have been infrequent, and she denies ever having been intubated for her asthma. She has had 3-4 days of progressive shortness of breath, in conjunction with an increase in her allergy symptoms. Feels that that is  likely the trigger of this exacerbation. Her shortness of breath got significantly worse today and she decided to come to the ED, where she was found on initial evaluation to be hypoxic. She was given treatment with nebulizers and Solu-Medrol and had some improvement in the ED, however her O2 sats were still in the high 80s to low 90s on room air, requiring oxygen to come up to normal level. Chest xray done in ER showed no pneumonia.  Patient was admitted, initiated on steroids, inhalation therapy, antibiotics and slowly improved.  Discussion by a  Problem: 7. Acute respiratory failure with hypoxia on admission. This has resolved patient is now on room air saturating well. 8. Acute asthma exacerbation- taper steroids at home. Continue inhalers and nebulizer treatments. Discharge home today , Start Z- pack 9. Type 2 diabetes mellitus- sugars were very  high which is steroid-induced. Continue Lantus. Switched to high-dose sliding scale. Patient also takes glipizide at home. Continue it. 10. Hypertension essential continue lisinopril and Norvasc. 11. Hyperlipidemia unspecified continue atorvastatin. 12. Headaches, likely due to cough will improve with improvement of patient's respiratory status.Added Humibid.  7.      Constipation start patient on senna and Colace    VITAL SIGNS:  Blood pressure 143/79, pulse 88, temperature 97.9 F (36.6 C), temperature source Oral, resp. rate 16, height 5\' 5"  (1.651 m), weight 104.554 kg (230 lb 8 oz), SpO2 96 %.  I/O:   Intake/Output Summary (Last 24 hours) at 02/01/15 1611 Last data filed at 02/01/15 1300  Gross per 24 hour  Intake    243 ml  Output   2200 ml  Net  -1957 ml    PHYSICAL EXAMINATION:  GENERAL:  49 y.o.-year-old patient lying in the bed with no acute distress.  EYES: Pupils equal, round, reactive to light and accommodation. No scleral icterus. Extraocular muscles intact.  HEENT: Head atraumatic, normocephalic. Oropharynx and nasopharynx clear.  NECK:  Supple, no jugular venous distention. No thyroid enlargement, no tenderness.  LUNGS: Normal breath sounds bilaterally, few wheezes noted bilaterally, more superficial and in upper lung fields, few rhonchi, no rales or crepitation. No use of accessory muscles of respiration. Good air entrance bilaterally. CARDIOVASCULAR: S1, S2 normal. No murmurs, rubs, or gallops.  ABDOMEN: Soft, non-tender, non-distended. Bowel sounds present. No organomegaly or mass.  EXTREMITIES: No pedal edema, cyanosis, or clubbing.  NEUROLOGIC: Cranial nerves II through XII are intact. Muscle strength 5/5 in all extremities. Sensation intact. Gait not checked.  PSYCHIATRIC: The patient is alert and oriented x 3.  SKIN: No obvious rash, lesion, or ulcer.   DATA REVIEW:   CBC  Recent Labs Lab 01/31/15 0531  WBC 12.8*  HGB 12.8  HCT 40.4  PLT 293     Chemistries   Recent Labs Lab 01/30/15 1657 01/31/15 0531  NA 137 136  K 3.6 4.0  CL 100* 102  CO2 28 26  GLUCOSE 119* 354*  BUN 9 11  CREATININE 0.51 0.60  CALCIUM 9.3 9.4  AST 17  --   ALT 19  --   ALKPHOS 96  --   BILITOT 0.2*  --     Cardiac Enzymes  Recent Labs Lab 01/30/15 1657  TROPONINI <0.03    Microbiology Results  Results for orders placed or performed in visit on 05/01/13  Culture, blood (single)     Status: None   Collection Time: 04/30/13  8:56 PM  Result Value Ref Range Status   Micro Text Report   Final  COMMENT                   NO GROWTH AEROBICALLY/ANAEROBICALLY IN 5 DAYS   ANTIBIOTIC                                                      Culture, blood (single)     Status: None   Collection Time: 04/30/13  8:56 PM  Result Value Ref Range Status   Micro Text Report   Final       COMMENT                   NO GROWTH AEROBICALLY/ANAEROBICALLY IN 5 DAYS   ANTIBIOTIC                                                        RADIOLOGY:  Dg Chest Port 1 View  01/30/2015   CLINICAL DATA:  Shortness of breath and chest pain today.  EXAM: PORTABLE CHEST - 1 VIEW  COMPARISON:  PA and lateral chest 12/16/2013.  FINDINGS: The lungs are clear. Heart size is upper normal. No pneumothorax or pleural effusion. No focal bony abnormality.  IMPRESSION: No acute disease.   Electronically Signed   By: Drusilla Kanner M.D.   On: 01/30/2015 17:23    EKG:   Orders placed or performed during the hospital encounter of 01/30/15  . EKG test  . EKG test  . EKG 12-Lead  . EKG 12-Lead      Management plans discussed with the patient, family and they are in agreement.  CODE STATUS:     Code Status Orders        Start     Ordered   01/30/15 2307  Full code   Continuous     01/30/15 2306      TOTAL TIME TAKING CARE OF THIS PATIENT: 40 minutes.    Katharina Caper M.D on 02/01/2015 at 4:11 PM  Between 7am to 6pm - Pager - [phone]  After  6pm go to [email or web address] - password [email or web address]  [facility]  Office  [phone]  CC: Primary care physician; Pcp Not In System

## 2015-02-01 NOTE — Care Management Note (Signed)
Case Management Note  Patient Details  Name: Cynthia Howe MRN: [id number] Date of Birth: 1965-10-02  Subjective/Objective:  Spoke with patient regarding discharge planning. Discussed with primary nurse. Patient has all her maintenance medications and inhalers. She will need any discharge medications to be prescribed off the $4 list at [facility].  Patient has a CPAP she uses at home. No home O2. No other needs identified.                  Action/Plan:   Expected Discharge Date:                  Expected Discharge Plan:  Home/Self Care  In-House Referral:     Discharge planning Services     Post Acute Care Choice:    Choice offered to:     DME Arranged:    DME Agency:     HH Arranged:    HH Agency:     Status of Service:     Medicare Important Message Given:    Date Medicare IM Given:    Medicare IM give by:    Date Additional Medicare IM Given:    Additional Medicare Important Message give by:     If discussed at Long Length of Stay Meetings, dates discussed:    Additional Comments:  Marily Memos, RN 02/01/2015, 10:51 AM

## 2015-02-01 NOTE — Progress Notes (Signed)
Patient ID: Cynthia Howe, female   DOB: [birth year], 49 y.o.   MRN: [id number] [facility] PROGRESS NOTE HPI/Subjective: Patient feels her breathing is good. Close to the  baseline. Some cough. Minimally productive. Top of the head  headache due to cough , overall feels better and is ready to go home Objective: Filed Vitals:   02/01/15 0738  BP: 143/79  Pulse: 88  Temp: 97.9 F (36.6 C)  Resp: 16    Intake/Output Summary (Last 24 hours) at 02/01/15 1109 Last data filed at 02/01/15 0739  Gross per 24 hour  Intake    121 ml  Output   2200 ml  Net  -2079 ml   Filed Weights   01/30/15 1646 01/30/15 2148  Weight: 104.327 kg (230 lb) 104.554 kg (230 lb 8 oz)    ROS: Review of Systems  Constitutional: Negative for fever and chills.  Eyes: Negative for blurred vision.  Respiratory: Positive for cough. Negative for sputum production, shortness of breath and wheezing.   Cardiovascular: Negative for chest pain.  Gastrointestinal: Negative for nausea, vomiting, abdominal pain, diarrhea and constipation.  Genitourinary: Negative for dysuria.  Musculoskeletal: Negative for joint pain.  Neurological: Positive for headaches. Negative for dizziness.   Exam: Physical Exam  HENT:  Nose: No mucosal edema.  Mouth/Throat: No oropharyngeal exudate or posterior oropharyngeal edema.  Eyes: Conjunctivae, EOM and lids are normal. Pupils are equal, round, and reactive to light.  Neck: No JVD present. Carotid bruit is not present. No edema present. No thyroid mass and no thyromegaly present.  Cardiovascular: Regular rhythm, S1 normal and S2 normal.  Tachycardia present.  Exam reveals no gallop.   No murmur heard. Pulses:      Dorsalis pedis pulses are 2+ on the right side, and 2+ on the left side.  Respiratory: No accessory muscle usage. No respiratory distress. She has decreased breath sounds in the right middle field, the right lower field, the left middle field and the left  lower field. She has no wheezes. She has no rhonchi. She has no rales.  GI: Soft. Bowel sounds are normal. There is no tenderness.  Musculoskeletal: She exhibits no edema.       Right ankle: She exhibits swelling.       Left ankle: She exhibits swelling.  Lymphadenopathy:    She has no cervical adenopathy.  Neurological: She is alert. No cranial nerve deficit.  Skin: Skin is warm. No rash noted. Nails show no clubbing.  Psychiatric: She has a normal mood and affect.   Data Reviewed: Basic Metabolic Panel:  Recent Labs Lab 01/30/15 1657 01/31/15 0531  NA 137 136  K 3.6 4.0  CL 100* 102  CO2 28 26  GLUCOSE 119* 354*  BUN 9 11  CREATININE 0.51 0.60  CALCIUM 9.3 9.4   Liver Function Tests:  Recent Labs Lab 01/30/15 1657  AST 17  ALT 19  ALKPHOS 96  BILITOT 0.2*  PROT 8.2*  ALBUMIN 4.0   CBC:  Recent Labs Lab 01/30/15 1657 01/31/15 0531  WBC 11.9* 12.8*  HGB 13.1 12.8  HCT 40.4 40.4  MCV 82.3 83.3  PLT 303 293   CBG:  Recent Labs Lab 01/31/15 1938 01/31/15 2139 02/01/15 0009 02/01/15 0411 02/01/15 0736  GLUCAP 340* 337* 356* 325* 209*   Studies: Dg Chest Port 1 View  01/30/2015   CLINICAL DATA:  Shortness of breath and chest pain today.  EXAM: PORTABLE CHEST - 1 VIEW  COMPARISON:  PA and  lateral chest 12/16/2013.  FINDINGS: The lungs are clear. Heart size is upper normal. No pneumothorax or pleural effusion. No focal bony abnormality.  IMPRESSION: No acute disease.   Electronically Signed   By: Drusilla Kanner M.D.   On: 01/30/2015 17:23    Scheduled Meds: . amLODipine  10 mg Oral Daily  . aspirin  81 mg Oral Daily  . atorvastatin  20 mg Oral Daily  . budesonide-formoterol  2 puff Inhalation BID  . enoxaparin (LOVENOX) injection  40 mg Subcutaneous Q24H  . insulin aspart  0-20 Units Subcutaneous 6 times per day  . insulin glargine  45 Units Subcutaneous QHS  . ipratropium-albuterol  3 mL Nebulization QID  . lisinopril  40 mg Oral Daily  .  methylPREDNISolone (SOLU-MEDROL) injection  40 mg Intravenous 3 times per day  . polyethylene glycol  17 g Oral Daily  . sodium chloride  3 mL Intravenous Q12H   Continuous Infusions:   Assessment/Plan:  1. Acute respiratory failure with hypoxia on admission. This has resolved patient is now on room air saturating well. 2. Acute asthma exacerbation- taper steroids. Continue inhalers and nebulizer treatments. Discharge home today  3. Type 2 diabetes mellitus- sugars are very high which is steroid-induced. Continue Lantus. Switched to high-dose sliding scale. Patient also takes glipizide at home. Continue it. 4. Hypertension essential continue lisinopril and Norvasc. 5. Hyperlipidemia unspecified continue atorvastatin. 6. Headaches, likely due to cough will improve with improvement of patient's respiratory status. 7. Constipation start patient on senna and Colace  Code Status:     Code Status Orders        Start     Ordered   01/30/15 2307  Full code   Continuous     01/30/15 2306     Family Communication: Sister at bedside Disposition Plan: Hopefully home tomorrow.  Time spent: in coordination of care.  [facility]  [facility]

## 2015-02-02 MED ORDER — INSULIN ASPART 100 UNIT/ML ~~LOC~~ SOLN: SUBCUTANEOUS | Status: AC

## 2015-07-06 ENCOUNTER — Emergency Department
Admission: EM | Admit: 2015-07-06 | Discharge: 2015-07-06 | Disposition: A | Discharge status: Home / Self Care | Payer: 59 | Attending: Emergency Medicine | Admitting: Emergency Medicine

## 2015-07-06 ENCOUNTER — Encounter: Payer: Self-pay | Admitting: Emergency Medicine

## 2015-07-06 DIAGNOSIS — I10 Essential (primary) hypertension: Secondary | ICD-10-CM | POA: Diagnosis not present

## 2015-07-06 DIAGNOSIS — R739 Hyperglycemia, unspecified: Secondary | ICD-10-CM

## 2015-07-06 DIAGNOSIS — Z792 Long term (current) use of antibiotics: Secondary | ICD-10-CM | POA: Diagnosis not present

## 2015-07-06 DIAGNOSIS — Z794 Long term (current) use of insulin: Secondary | ICD-10-CM | POA: Diagnosis not present

## 2015-07-06 DIAGNOSIS — T380X5A Adverse effect of glucocorticoids and synthetic analogues, initial encounter: Secondary | ICD-10-CM

## 2015-07-06 DIAGNOSIS — Z79899 Other long term (current) drug therapy: Secondary | ICD-10-CM | POA: Insufficient documentation

## 2015-07-06 DIAGNOSIS — Z7982 Long term (current) use of aspirin: Secondary | ICD-10-CM | POA: Insufficient documentation

## 2015-07-06 DIAGNOSIS — E1165 Type 2 diabetes mellitus with hyperglycemia: Secondary | ICD-10-CM | POA: Diagnosis present

## 2015-07-06 DIAGNOSIS — E0965 Drug or chemical induced diabetes mellitus with hyperglycemia: Secondary | ICD-10-CM | POA: Insufficient documentation

## 2015-07-06 DIAGNOSIS — R450 Nervousness: Secondary | ICD-10-CM | POA: Diagnosis not present

## 2015-07-06 DIAGNOSIS — E669 Obesity, unspecified: Secondary | ICD-10-CM | POA: Insufficient documentation

## 2015-07-06 LAB — BLOOD GAS, VENOUS
ACID-BASE EXCESS: 2.5 mmol/L (ref 0.0–3.0)
Bicarbonate: 27.9 mEq/L (ref 21.0–28.0)
PATIENT TEMPERATURE: 37
PH VEN: 7.4 (ref 7.320–7.430)
pCO2, Ven: 45 mmHg (ref 44.0–60.0)

## 2015-07-06 LAB — URINALYSIS COMPLETE WITH MICROSCOPIC (ARMC ONLY)
BILIRUBIN URINE: NEGATIVE
Bacteria, UA: NONE SEEN
Glucose, UA: >500 mg/dL — AB
HGB URINE DIPSTICK: NEGATIVE
Ketones, ur: NEGATIVE mg/dL
LEUKOCYTES UA: NEGATIVE
NITRITE: NEGATIVE
Protein, ur: NEGATIVE mg/dL
SPECIFIC GRAVITY, URINE: 1.024 (ref 1.005–1.030)
WBC, UA: NONE SEEN WBC/hpf (ref 0–5)
pH: 7 (ref 5.0–8.0)

## 2015-07-06 LAB — BASIC METABOLIC PANEL
Anion gap: 9 (ref 5–15)
BUN: 12 mg/dL (ref 6–20)
CALCIUM: 9.2 mg/dL (ref 8.9–10.3)
CO2: 25 mmol/L (ref 22–32)
Chloride: 99 mmol/L — ABNORMAL LOW (ref 101–111)
Creatinine, Ser: 0.55 mg/dL (ref 0.44–1.00)
GFR calc non Af Amer: >60 mL/min (ref >60)
GLUCOSE: 431 mg/dL — AB (ref 65–99)
Potassium: 4.5 mmol/L (ref 3.5–5.1)
Sodium: 133 mmol/L — ABNORMAL LOW (ref 135–145)

## 2015-07-06 LAB — CBC
HCT: 41.2 % (ref 35.0–47.0)
Hemoglobin: 13.3 g/dL (ref 12.0–16.0)
MCH: 26 pg (ref 26.0–34.0)
MCHC: 32.3 g/dL (ref 32.0–36.0)
MCV: 80.5 fL (ref 80.0–100.0)
PLATELETS: 297 10*3/uL (ref 150–440)
RBC: 5.11 MIL/uL (ref 3.80–5.20)
RDW: 14.5 % (ref 11.5–14.5)
WBC: 11.9 10*3/uL — ABNORMAL HIGH (ref 3.6–11.0)

## 2015-07-06 LAB — GLUCOSE, CAPILLARY
GLUCOSE-CAPILLARY: 410 mg/dL — AB (ref 65–99)
Glucose-Capillary: 345 mg/dL — ABNORMAL HIGH (ref 65–99)
Glucose-Capillary: 254 mg/dL — ABNORMAL HIGH (ref 65–99)

## 2015-07-06 MED ORDER — INSULIN PEN NEEDLE 32G X 4 MM MISC: 1.0000 [IU] | Status: AC | PRN

## 2015-07-06 MED ORDER — SODIUM CHLORIDE 0.9 % IV BOLUS (SEPSIS)
1000.0000 mL | Freq: Once | INTRAVENOUS | Status: AC
  Administered 2015-07-06: 1000 mL via INTRAVENOUS

## 2015-07-06 MED ORDER — INSULIN ASPART 100 UNIT/ML ~~LOC~~ SOLN
10.0000 [IU] | Freq: Once | SUBCUTANEOUS | Status: AC
  Administered 2015-07-06: 10 [IU] via SUBCUTANEOUS
  Filled 2015-07-06: qty 10

## 2015-07-06 NOTE — ED Notes (Signed)
Pt states she was told by her supervisor that she seemed "jittery" at work, states she checked her BS and it was 491, states she has been taking prednisone and took the last dose this am

## 2015-07-06 NOTE — ED Notes (Signed)
AAOx3.  Skin warm and dry. NAD.  Ambulates with easy and steady gait.   

## 2015-07-06 NOTE — Discharge Instructions (Signed)
Please have your primary care physician reevaluate the medications that you take for your diabetes.  Please return to the emergency department if you develop abdominal pain, nausea or vomiting, diarrhea, fever, shortness of breath or cough, or any other symptoms concerning to you.

## 2015-07-06 NOTE — ED Provider Notes (Signed)
[facility] Emergency Department Provider Note  ____________________________________________  Time seen: Approximately 3:48 PM  I have reviewed the triage vital signs and the nursing notes.   HISTORY  Chief Complaint Hyperglycemia    HPI Cynthia Howe is a 49 y.o. female with a history of DM 2 on oral anti-hyperglycemics, long-acting insulin at night, and short acting insulin on a sliding scale presenting with hyperglycemia area did patient states that she was at work and her coworker said that she was "jittery." She has felt well, but has noticed that since starting prednisone course for an acute asthma exacerbation that her blood sugars have been in the 3 and 400s. This is happened to her in the past and she states that it usually takes "a whole month" until her blood sugars normalized. Today was her last dose of prednisone. At 11 AM, her blood sugar was in the 400s and she took 12 units of NovoLog. She denies any chest pain, shortness of breath, cough, urinary symptoms, nausea or vomiting, abdominal pain, or diarrhea.   Past Medical History  Diagnosis Date  . Asthma   . Hypertension   . Diabetes mellitus without complication (HCC)   . Sleep apnea     Patient Active Problem List   Diagnosis Date Noted  . Acute respiratory failure with hypoxia (HCC) 02/01/2015  . Headache 02/01/2015  . Constipation 02/01/2015  . Asthma with exacerbation 01/30/2015  . Diabetes type 2, controlled (HCC) 01/30/2015  . HTN (hypertension) 01/30/2015  . HLD (hyperlipidemia) 01/30/2015  . OSA on CPAP 01/30/2015  . Asthma with acute exacerbation 01/30/2015    Past Surgical History  Procedure Laterality Date  . Knee arthroscopy    . Abdominal hysterectomy    . Oophorectomy    . Patella fracture surgery      Current Outpatient Rx  Name  Route  Sig  Dispense  Refill  . albuterol (PROVENTIL HFA;VENTOLIN HFA) 108 (90 BASE) MCG/ACT inhaler   Inhalation   Inhale 1 puff  into the lungs every 6 (six) hours as needed for wheezing or shortness of breath.         Marland Kitchen amLODipine (NORVASC) 10 MG tablet   Oral   Take 10 mg by mouth daily.         Marland Kitchen aspirin EC 81 MG tablet   Oral   Take 81 mg by mouth daily.         Marland Kitchen azithromycin (ZITHROMAX Z-PAK) 250 MG tablet      Take as directed   6 each   0   . budesonide-formoterol (SYMBICORT) 160-4.5 MCG/ACT inhaler   Inhalation   Inhale 2 puffs into the lungs 2 (two) times daily.         . cyclobenzaprine (FLEXERIL) 10 MG tablet   Oral   Take 10 mg by mouth 2 (two) times daily as needed for muscle spasms.         Marland Kitchen dextromethorphan-guaiFENesin (MUCINEX DM) 30-600 MG per 12 hr tablet   Oral   Take 1 tablet by mouth 2 (two) times daily.   30 tablet   0   . glipiZIDE (GLUCOTROL XL) 10 MG 24 hr tablet   Oral   Take 10 mg by mouth daily.          Marland Kitchen HYDROcodone-acetaminophen (NORCO/VICODIN) 5-325 MG per tablet   Oral   Take 1 tablet by mouth every 4 (four) hours as needed for moderate pain.   15 tablet   0   .  insulin aspart (NOVOLOG) 100 UNIT/ML injection      Before each meal 3 times a day, 140-199 - 2 units, 200-250 - 4 units, 251-299 - 6 units,  300-349 - 8 units,  350 or above 10 units. Dispense syringes and needles as needed, Ok to switch to PEN if approved.   1 vial   12   . insulin aspart (NOVOLOG) 100 UNIT/ML injection      Before each meal 3 times a day, 140-199 - 2 units, 200-250 - 4 units, 251-299 - 6 units,  300-349 - 8 units,  350 or above 10 units. Dispense syringes and needles as needed, Ok to switch to PEN if approved.   1 vial   12   . insulin glargine (LANTUS) 100 UNIT/ML injection   Subcutaneous   Inject 30 Units into the skin daily.          Marland Kitchen. ipratropium-albuterol (DUONEB) 0.5-2.5 (3) MG/3ML SOLN   Nebulization   Take 3 mLs by nebulization every 8 (eight) hours as needed (for shortness of breath).          Marland Kitchen. lisinopril (PRINIVIL,ZESTRIL) 40 MG tablet    Oral   Take 40 mg by mouth daily.         . methylPREDNISolone (MEDROL DOSEPAK) 4 MG TBPK tablet      follow package directions   21 tablet   0   . Phenylephrine-Acetaminophen (TYLENOL SINUS CONGESTION/PAIN PO)   Oral   Take 2 tablets by mouth 3 (three) times daily as needed (for sinus pressure).         . senna-docusate (SENOKOT-S) 8.6-50 MG per tablet   Oral   Take 1 tablet by mouth at bedtime as needed for mild constipation.   30 tablet   0     Allergies Ciprofloxacin hcl and Septra  Family History  Problem Relation Age of Onset  . Diabetes Mother   . Cancer Mother   . Hypertension Father   . Diabetes Father   . Cancer Father   . Heart disease Father   . Heart disease Maternal Uncle   . Heart disease Paternal Grandfather   . Cancer Cousin     Social History Social History  Substance Use Topics  . Smoking status: Never Smoker   . Smokeless tobacco: None  . Alcohol Use: No    Review of Systems Constitutional: No fever/chills. No lightheadedness or syncope. "Jittery" feeling. Eyes: No visual changes. ENT: No sore throat. Cardiovascular: Denies chest pain, palpitations. Respiratory: Denies shortness of breath.  No cough. Gastrointestinal: No abdominal pain.  No nausea, no vomiting.  No diarrhea.  No constipation. Genitourinary: Negative for dysuria. Musculoskeletal: Negative for back pain. Skin: Negative for rash. Neurological: Negative for headaches, focal weakness or numbness.  10-point ROS otherwise negative.  ____________________________________________   PHYSICAL EXAM:  VITAL SIGNS: ED Triage Vitals  Enc Vitals Group     BP 07/06/15 1408 137/76 mmHg     Pulse Rate 07/06/15 1408 106     Resp 07/06/15 1408 20     Temp 07/06/15 1408 97.8 F (36.6 C)     Temp Source 07/06/15 1408 Oral     SpO2 07/06/15 1408 96 %     Weight 07/06/15 1408 232 lb (105.235 kg)     Height 07/06/15 1408 5\' 5"  (1.651 m)     Head Cir --      Peak Flow --       Pain Score 07/06/15 1406 0  Pain Loc --      Pain Edu? --      Excl. in GC? --     Constitutional: Alert and oriented. Well appearing and in no acute distress. Answer question appropriately. Eyes: Conjunctivae are normal.  EOMI. no scleral icterus. Head: Atraumatic. Nose: No congestion/rhinnorhea. Mouth/Throat: Mucous membranes are moist.  Neck: No stridor.  Supple.   Cardiovascular: Normal rate, regular rhythm. No murmurs, rubs or gallops.  Respiratory: Normal respiratory effort.  No retractions. Lungs CTAB.  No wheezes, rales or ronchi. Gastrointestinal: Abdomen is obese, soft, nondistended and nontender. No guarding or rebound. No peritoneal signs. Musculoskeletal: No LE edema.  Neurologic:  Normal speech and language. No gross focal neurologic deficits are appreciated.  Skin:  Skin is warm, dry and intact. No rash noted. Psychiatric: Mood and affect are normal. Speech and behavior are normal.  Normal judgement.  ____________________________________________   LABS (all labs ordered are listed, but only abnormal results are displayed)  Labs Reviewed  BASIC METABOLIC PANEL - Abnormal; Notable for the following:    Sodium 133 (*)    Chloride 99 (*)    Glucose, Bld 431 (*)    All other components within normal limits  CBC - Abnormal; Notable for the following:    WBC 11.9 (*)    All other components within normal limits  URINALYSIS COMPLETEWITH MICROSCOPIC ([facility] ONLY) - Abnormal; Notable for the following:    Color, Urine STRAW (*)    APPearance CLEAR (*)    Glucose, UA >500 (*)    Squamous Epithelial / LPF 0-5 (*)    All other components within normal limits  GLUCOSE, CAPILLARY - Abnormal; Notable for the following:    Glucose-Capillary 410 (*)    All other components within normal limits  GLUCOSE, CAPILLARY - Abnormal; Notable for the following:    Glucose-Capillary 345 (*)    All other components within normal limits  BLOOD GAS, VENOUS  CBG MONITORING, ED    ____________________________________________  EKG  Not indicated. ____________________________________________  RADIOLOGY  No results found.  ____________________________________________   PROCEDURES  Procedure(s) performed: None  Critical Care performed: No ____________________________________________   INITIAL IMPRESSION / ASSESSMENT AND PLAN / ED COURSE  Pertinent labs & imaging results that were available during my care of the patient were reviewed by me and considered in my medical decision making (see chart for details).  49 y.o. female with a history of DM 2 and recent steroid course for asthma flare presenting with hyperglycemia that is asymptomatic to her. Patient denies any other associated symptoms. On my exam, her lungs are clear and there is no evidence for continued asthma exacerbation or acute infection. The driver of her hyperglycemia is likely her prednisone. Her labs from triage show blood sugar in the 400s but the patient does not have an anion gap and does not have DKA. I will treat her blood sugars and have her continue her anti-hyperglycemics at home as previously prescribed by her PMD. I will have her follow-up with her PMD closely.  ----------------------------------------- 4:11 PM on 07/06/2015 -----------------------------------------  The patient continues to feel well and her blood sugar has dropped into the 300s. I will plan discharge home with continued use of her sliding scale insulin as needed. She'll follow up with her primary care physician shortly. She understands return precautions as well as follow-up instructions. ____________________________________________  FINAL CLINICAL IMPRESSION(S) / ED DIAGNOSES  Final diagnoses:  Steroid-induced hyperglycemia  Jittery      NEW MEDICATIONS STARTED  DURING THIS VISIT:  New Prescriptions   No medications on file     Rockne Menghini, MD 07/06/15 [phone]

## 2017-12-31 ENCOUNTER — Encounter: Payer: Self-pay | Admitting: Emergency Medicine

## 2017-12-31 ENCOUNTER — Emergency Department
Admission: EM | Admit: 2017-12-31 | Discharge: 2018-01-01 | Disposition: A | Discharge status: Home / Self Care | Payer: 59 | Attending: Emergency Medicine | Admitting: Emergency Medicine

## 2017-12-31 DIAGNOSIS — I10 Essential (primary) hypertension: Secondary | ICD-10-CM | POA: Insufficient documentation

## 2017-12-31 DIAGNOSIS — M79602 Pain in left arm: Secondary | ICD-10-CM | POA: Insufficient documentation

## 2017-12-31 DIAGNOSIS — M79651 Pain in right thigh: Secondary | ICD-10-CM | POA: Insufficient documentation

## 2017-12-31 DIAGNOSIS — M79601 Pain in right arm: Secondary | ICD-10-CM | POA: Insufficient documentation

## 2017-12-31 DIAGNOSIS — M791 Myalgia, unspecified site: Secondary | ICD-10-CM | POA: Insufficient documentation

## 2017-12-31 DIAGNOSIS — J45909 Unspecified asthma, uncomplicated: Secondary | ICD-10-CM | POA: Insufficient documentation

## 2017-12-31 DIAGNOSIS — Z794 Long term (current) use of insulin: Secondary | ICD-10-CM | POA: Insufficient documentation

## 2017-12-31 DIAGNOSIS — M7918 Myalgia, other site: Secondary | ICD-10-CM

## 2017-12-31 DIAGNOSIS — E119 Type 2 diabetes mellitus without complications: Secondary | ICD-10-CM | POA: Insufficient documentation

## 2017-12-31 MED ORDER — ACETAMINOPHEN 325 MG PO TABS
650.0000 mg | ORAL_TABLET | Freq: Once | ORAL | Status: AC
  Administered 2017-12-31: 650 mg via ORAL
  Filled 2017-12-31: qty 2
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
## 2018-01-01 MED ORDER — IBUPROFEN 600 MG PO TABS: 600.0000 mg | ORAL_TABLET | Freq: Three times a day (TID) | ORAL | 1 refills | Status: AC | PRN

## 2018-01-01 MED ORDER — CYCLOBENZAPRINE HCL 5 MG PO TABS: 5.0000 mg | ORAL_TABLET | Freq: Three times a day (TID) | ORAL | 0 refills | Status: AC | PRN
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
## 2018-03-29 ENCOUNTER — Other Ambulatory Visit: Payer: Self-pay

## 2018-03-29 ENCOUNTER — Emergency Department
Admission: EM | Admit: 2018-03-29 | Discharge: 2018-03-29 | Disposition: A | Discharge status: Home / Self Care | Payer: Self-pay | Attending: Emergency Medicine | Admitting: Emergency Medicine

## 2018-03-29 DIAGNOSIS — Z79899 Other long term (current) drug therapy: Secondary | ICD-10-CM | POA: Insufficient documentation

## 2018-03-29 DIAGNOSIS — R739 Hyperglycemia, unspecified: Secondary | ICD-10-CM

## 2018-03-29 DIAGNOSIS — I10 Essential (primary) hypertension: Secondary | ICD-10-CM | POA: Insufficient documentation

## 2018-03-29 DIAGNOSIS — E0965 Drug or chemical induced diabetes mellitus with hyperglycemia: Secondary | ICD-10-CM | POA: Insufficient documentation

## 2018-03-29 DIAGNOSIS — Z794 Long term (current) use of insulin: Secondary | ICD-10-CM | POA: Insufficient documentation

## 2018-03-29 DIAGNOSIS — Z7982 Long term (current) use of aspirin: Secondary | ICD-10-CM | POA: Insufficient documentation

## 2018-03-29 DIAGNOSIS — T380X5A Adverse effect of glucocorticoids and synthetic analogues, initial encounter: Secondary | ICD-10-CM

## 2018-03-29 DIAGNOSIS — J45909 Unspecified asthma, uncomplicated: Secondary | ICD-10-CM | POA: Insufficient documentation

## 2018-03-29 LAB — CBC
HEMATOCRIT: 35.8 % (ref 35.0–47.0)
HEMOGLOBIN: 12 g/dL (ref 12.0–16.0)
MCH: 25.1 pg — ABNORMAL LOW (ref 26.0–34.0)
MCHC: 33.4 g/dL (ref 32.0–36.0)
MCV: 75.2 fL — AB (ref 80.0–100.0)
Platelets: 327 10*3/uL (ref 150–440)
RBC: 4.76 MIL/uL (ref 3.80–5.20)
RDW: 16.1 % — AB (ref 11.5–14.5)
WBC: 11.6 10*3/uL — AB (ref 3.6–11.0)

## 2018-03-29 LAB — BASIC METABOLIC PANEL
ANION GAP: 9 (ref 5–15)
BUN: 14 mg/dL (ref 6–20)
CHLORIDE: 99 mmol/L (ref 98–111)
CO2: 27 mmol/L (ref 22–32)
Calcium: 8.4 mg/dL — ABNORMAL LOW (ref 8.9–10.3)
Creatinine, Ser: 0.5 mg/dL (ref 0.44–1.00)
GFR calc Af Amer: >60 mL/min (ref >60)
GLUCOSE: 495 mg/dL — AB (ref 70–99)
POTASSIUM: 3.6 mmol/L (ref 3.5–5.1)
SODIUM: 135 mmol/L (ref 135–145)

## 2018-03-29 LAB — GLUCOSE, CAPILLARY
GLUCOSE-CAPILLARY: 356 mg/dL — AB (ref 70–99)
Glucose-Capillary: 482 mg/dL — ABNORMAL HIGH (ref 70–99)
Glucose-Capillary: 223 mg/dL — ABNORMAL HIGH (ref 70–99)

## 2018-03-29 LAB — URINALYSIS, COMPLETE (UACMP) WITH MICROSCOPIC
BILIRUBIN URINE: NEGATIVE
Bacteria, UA: NONE SEEN
Hgb urine dipstick: NEGATIVE
Ketones, ur: NEGATIVE mg/dL
Leukocytes, UA: NEGATIVE
Nitrite: NEGATIVE
Protein, ur: NEGATIVE mg/dL
SPECIFIC GRAVITY, URINE: 1.018 (ref 1.005–1.030)
pH: 7 (ref 5.0–8.0)

## 2018-03-29 MED ORDER — SODIUM CHLORIDE 0.9 % IV BOLUS
1000.0000 mL | Freq: Once | INTRAVENOUS | Status: AC
  Administered 2018-03-29: 1000 mL via INTRAVENOUS

## 2018-03-29 MED ORDER — INSULIN ASPART 100 UNIT/ML ~~LOC~~ SOLN
8.0000 [IU] | Freq: Once | SUBCUTANEOUS | Status: AC
  Administered 2018-03-29: 8 [IU] via SUBCUTANEOUS
  Filled 2018-03-29: qty 1

## 2018-03-29 NOTE — ED Provider Notes (Signed)
[facility] Emergency Department Provider Note   ____________________________________________   First MD Initiated Contact with Patient 03/29/18 [phone]     (approximate)  I have reviewed the triage vital signs and the nursing notes.   HISTORY  Chief Complaint Hyperglycemia    HPI Vedia PereyraVeronica D Zhan is a 52 y.o. female who presents to the ED from home with a chief complaint of hyperglycemia.  Patient is an insulin-dependent diabetic who also takes metformin.  States she only checks her blood sugars when she is on a steroid.  Her doctor placed her on prednisone 3 days ago for respiratory infection.  Her glucometer read 509 at home which patient states is the highest she is ever seen.  Baseline blood sugars ranging from 180-200s.  Only symptoms include polydipsia and polyuria.  Denies fever, chills, chest pain, shortness of breath, abdominal pain, nausea, vomiting or dysuria.  Denies recent travel or trauma.  Did not take her nighttime Lantus tonight.   Past Medical History:  Diagnosis Date  . Asthma   . Diabetes mellitus without complication (HCC)   . Hypertension   . Sleep apnea     Patient Active Problem List   Diagnosis Date Noted  . Acute respiratory failure with hypoxia (HCC) 02/01/2015  . Headache 02/01/2015  . Constipation 02/01/2015  . Asthma with exacerbation 01/30/2015  . Diabetes type 2, controlled (HCC) 01/30/2015  . HTN (hypertension) 01/30/2015  . HLD (hyperlipidemia) 01/30/2015  . OSA on CPAP 01/30/2015  . Asthma with acute exacerbation 01/30/2015    Past Surgical History:  Procedure Laterality Date  . ABDOMINAL HYSTERECTOMY    . KNEE ARTHROSCOPY    . OOPHORECTOMY    . PATELLA FRACTURE SURGERY      Prior to Admission medications   Medication Sig Start Date End Date Taking? Authorizing Provider  albuterol (PROVENTIL HFA;VENTOLIN HFA) 108 (90 BASE) MCG/ACT inhaler Inhale 1 puff into the lungs every 6 (six) hours as needed for  wheezing or shortness of breath.    [provider]  amLODipine (NORVASC) 10 MG tablet Take 10 mg by mouth daily.    [provider]  aspirin EC 81 MG tablet Take 81 mg by mouth daily.    [provider]  atorvastatin (LIPITOR) 40 MG tablet Take 40 mg by mouth daily.    [provider]  budesonide-formoterol (SYMBICORT) 160-4.5 MCG/ACT inhaler Inhale 2 puffs into the lungs 2 (two) times daily.    [provider]  cyclobenzaprine (FLEXERIL) 5 MG tablet Take 1 tablet (5 mg total) by mouth 3 (three) times daily as needed for muscle spasms. 01/01/18   Menshew, Charlesetta IvoryJenise V Bacon, PA-C  ibuprofen (ADVIL,MOTRIN) 600 MG tablet Take 1 tablet (600 mg total) by mouth every 8 (eight) hours as needed. 01/01/18   Menshew, Charlesetta IvoryJenise V Bacon, PA-C  insulin aspart (NOVOLOG) 100 UNIT/ML injection Before each meal 3 times a day, 140-199 - 2 units, 200-250 - 4 units, 251-299 - 6 units,  300-349 - 8 units,  350 or above 10 units. Dispense syringes and needles as needed, Ok to switch to PEN if approved. 02/01/15   Katharina CaperVaickute, Rima, MD  insulin aspart (NOVOLOG) 100 UNIT/ML injection Before each meal 3 times a day, 140-199 - 2 units, 200-250 - 4 units, 251-299 - 6 units,  300-349 - 8 units,  350 or above 10 units. Dispense syringes and needles as needed, Ok to switch to PEN if approved. 02/02/15   Katharina CaperVaickute, Rima, MD  insulin glargine (  LANTUS) 100 UNIT/ML injection Inject 30 Units into the skin daily.     [provider]  Insulin Pen Needle (BD PEN NEEDLE NANO U/F) 32G X 4 MM MISC 1 Units by Does not apply route as needed. 07/06/15   Rockne Menghini, MD  ipratropium-albuterol (DUONEB) 0.5-2.5 (3) MG/3ML SOLN Take 3 mLs by nebulization every 8 (eight) hours as needed (for shortness of breath).     [provider]  senna-docusate (SENOKOT-S) 8.6-50 MG per tablet Take 1 tablet by mouth at bedtime as needed for mild constipation. 02/01/15   Katharina Caper, MD     Allergies Ciprofloxacin hcl and Septra [sulfamethoxazole-trimethoprim]  Family History  Problem Relation Age of Onset  . Diabetes Mother   . Cancer Mother   . Hypertension Father   . Diabetes Father   . Cancer Father   . Heart disease Father   . Heart disease Maternal Uncle   . Heart disease Paternal Grandfather   . Cancer Cousin     Social History Social History   Tobacco Use  . Smoking status: Never Smoker  . Smokeless tobacco: Never Used  Substance Use Topics  . Alcohol use: No    Alcohol/week: 0.0 standard drinks  . Drug use: No    Review of Systems  Constitutional: No fever/chills Eyes: No visual changes. ENT: No sore throat. Cardiovascular: Denies chest pain. Respiratory: Denies shortness of breath. Gastrointestinal: No abdominal pain.  No nausea, no vomiting.  No diarrhea.  No constipation. Genitourinary: Negative for dysuria. Musculoskeletal: Negative for back pain. Skin: Negative for rash. Neurological: Negative for headaches, focal weakness or numbness. Endocrine: Positive for polydipsia and polyuria.  ____________________________________________   PHYSICAL EXAM:  VITAL SIGNS: ED Triage Vitals  Enc Vitals Group     BP 03/29/18 0142 (!) 141/80     Pulse Rate 03/29/18 0142 94     Resp 03/29/18 0142 18     Temp 03/29/18 0142 97.7 F (36.5 C)     Temp Source 03/29/18 0142 Oral     SpO2 03/29/18 0142 97 %     Weight 03/29/18 0140 229 lb (103.9 kg)     Height 03/29/18 0140 5\' 5"  (1.651 m)     Head Circumference --      Peak Flow --      Pain Score 03/29/18 0141 0     Pain Loc --      Pain Edu? --      Excl. in GC? --     Constitutional: Asleep, awakened for exam.  Alert and oriented. Well appearing and in no acute distress. Eyes: Conjunctivae are normal. PERRL. EOMI. Head: Atraumatic. Nose: No congestion/rhinnorhea. Mouth/Throat: Mucous membranes are moist.  Oropharynx non-erythematous. Neck: No stridor.   Cardiovascular: Normal rate,  regular rhythm. Grossly normal heart sounds.  Good peripheral circulation. Respiratory: Normal respiratory effort.  No retractions. Lungs CTAB. Gastrointestinal: Soft and nontender. No distention. No abdominal bruits. No CVA tenderness. Musculoskeletal: No lower extremity tenderness nor edema.  No joint effusions. Neurologic:  Normal speech and language. No gross focal neurologic deficits are appreciated. No gait instability. Skin:  Skin is warm, dry and intact. No rash noted. Psychiatric: Mood and affect are normal. Speech and behavior are normal.  ____________________________________________   LABS (all labs ordered are listed, but only abnormal results are displayed)  Labs Reviewed  GLUCOSE, CAPILLARY - Abnormal; Notable for the following components:      Result Value   Glucose-Capillary 482 (*)    All other components  within normal limits  BASIC METABOLIC PANEL - Abnormal; Notable for the following components:   Glucose, Bld 495 (*)    Calcium 8.4 (*)    All other components within normal limits  CBC - Abnormal; Notable for the following components:   WBC 11.6 (*)    MCV 75.2 (*)    MCH 25.1 (*)    RDW 16.1 (*)    All other components within normal limits  URINALYSIS, COMPLETE (UACMP) WITH MICROSCOPIC - Abnormal; Notable for the following components:   Color, Urine STRAW (*)    APPearance CLEAR (*)    Glucose, UA >=500 (*)    All other components within normal limits  GLUCOSE, CAPILLARY - Abnormal; Notable for the following components:   Glucose-Capillary 356 (*)    All other components within normal limits  GLUCOSE, CAPILLARY - Abnormal; Notable for the following components:   Glucose-Capillary 223 (*)    All other components within normal limits  CBG MONITORING, ED  CBG MONITORING, ED   ____________________________________________  EKG  None ____________________________________________  RADIOLOGY  ED MD interpretation: None  Official radiology report(s): No  results found.  ____________________________________________   PROCEDURES  Procedure(s) performed: None  Procedures  Critical Care performed: No  ____________________________________________   INITIAL IMPRESSION / ASSESSMENT AND PLAN / ED COURSE  As part of my medical decision making, I reviewed the following data within the electronic MEDICAL RECORD NUMBER Nursing notes reviewed and incorporated, Labs reviewed, Old chart reviewed and Notes from prior ED visits   52 year old diabetic who presents with steroid-induced hyperglycemia.  Differential diagnosis includes but is not limited to infectious, metabolic, steroid-induced etiologies, etc.  After 1 L normal saline, patient's blood sugar has decreased from 495-356.  She missed her evening dose of Lantus which she normally takes around 11 PM.  She is due for her first metformin around 7 AM.  Will administer second liter IV fluid and administer 8 units regular insulin subcu.  Will recheck blood sugar in an hour.  Clinical Course as of Mar 30 743  Wynelle Link Mar 29, 2018  7829 Repeat blood sugar is 223.  I have advised the patient to stick to her medication regimen and her blood sugar will start trending down when she is off the steroid.  Strict return precautions given.  Patient verbalizes understanding agrees with plan of care.   [JS]    Clinical Course User Index [JS] Irean Hong, MD     ____________________________________________   FINAL CLINICAL IMPRESSION(S) / ED DIAGNOSES  Final diagnoses:  Steroid-induced hyperglycemia     ED Discharge Orders    None       Note:  This document was prepared using Dragon voice recognition software and may include unintentional dictation errors.    Irean Hong, MD 04/05/18 [phone]

## 2018-03-29 NOTE — ED Triage Notes (Signed)
Patient reports checked "sugar" tonight and her meter read 509 at home.

## 2018-03-29 NOTE — ED Notes (Addendum)
Pt ringing call bell. Pt states "is this how you do people, just plop them in a room and don't give them anything, I had to reach for the remote and that call bell to call you". Pt then states "I don't like that nurse out front, she made me walk back here and didn't give me no pillow". Pt informed this rn will be her nurse in the treatment room. Pt requesting additional pillows, drink, blankets for her visitors and herself. Additional supplies provided. Pt encouraged to provide urine sample. Pt states "I just went before I came back here, I don't feel like getting up again".

## 2018-03-29 NOTE — ED Notes (Signed)
Lydia, ed tech in to encourage pt to provide urine sample. Pt complies with tech request.

## 2018-03-29 NOTE — Discharge Instructions (Signed)
Steroids will temporarily increase your blood sugar.  Make sure you are still taking your medicines as directed by your doctor.  Return to the ER for worsening symptoms, persistent vomiting, difficulty breathing or other concerns.

## 2018-03-29 NOTE — ED Notes (Signed)
Assessment: pt states she checks her blood sugar "when I feel like it". Pt states she usually takes her blood sugar when she feels like it is high. Pt states tonight her meter read 509. Pt with unlabored resps, no nausea, no vomiting. Alert and oriented x4.

## 2018-03-29 NOTE — ED Notes (Signed)
Pt sleeping, arm bent, ivf have only infused approx . Pt encouraged to keep arm straight for ivf infusion.

## 2018-03-29 NOTE — ED Notes (Signed)
Patient given a warm blanket at this time.  

## 2018-03-29 NOTE — ED Notes (Signed)
Report to jessica, rn

## 2021-06-25 ENCOUNTER — Other Ambulatory Visit
Admission: RE | Admit: 2021-06-25 | Discharge: 2021-06-25 | Disposition: A | Discharge status: Home / Self Care | Payer: Managed Care, Other (non HMO) | Source: Home / Self Care | Attending: Internal Medicine | Admitting: Internal Medicine

## 2021-06-25 ENCOUNTER — Emergency Department
Admission: EM | Admit: 2021-06-25 | Discharge: 2021-06-25 | Disposition: A | Discharge status: Home / Self Care | Payer: Managed Care, Other (non HMO) | Attending: Emergency Medicine | Admitting: Emergency Medicine

## 2021-06-25 ENCOUNTER — Encounter: Payer: Self-pay | Admitting: Emergency Medicine

## 2021-06-25 ENCOUNTER — Other Ambulatory Visit: Payer: Self-pay

## 2021-06-25 ENCOUNTER — Emergency Department: Payer: Managed Care, Other (non HMO)

## 2021-06-25 DIAGNOSIS — Z7982 Long term (current) use of aspirin: Secondary | ICD-10-CM | POA: Diagnosis not present

## 2021-06-25 DIAGNOSIS — M7989 Other specified soft tissue disorders: Secondary | ICD-10-CM | POA: Insufficient documentation

## 2021-06-25 DIAGNOSIS — M79604 Pain in right leg: Secondary | ICD-10-CM | POA: Insufficient documentation

## 2021-06-25 DIAGNOSIS — M79661 Pain in right lower leg: Secondary | ICD-10-CM | POA: Diagnosis not present

## 2021-06-25 DIAGNOSIS — E1165 Type 2 diabetes mellitus with hyperglycemia: Secondary | ICD-10-CM | POA: Insufficient documentation

## 2021-06-25 DIAGNOSIS — R739 Hyperglycemia, unspecified: Secondary | ICD-10-CM

## 2021-06-25 DIAGNOSIS — R600 Localized edema: Secondary | ICD-10-CM | POA: Insufficient documentation

## 2021-06-25 DIAGNOSIS — I10 Essential (primary) hypertension: Secondary | ICD-10-CM | POA: Insufficient documentation

## 2021-06-25 DIAGNOSIS — M79662 Pain in left lower leg: Secondary | ICD-10-CM | POA: Diagnosis not present

## 2021-06-25 DIAGNOSIS — J45909 Unspecified asthma, uncomplicated: Secondary | ICD-10-CM | POA: Insufficient documentation

## 2021-06-25 DIAGNOSIS — M79605 Pain in left leg: Secondary | ICD-10-CM

## 2021-06-25 DIAGNOSIS — Z79899 Other long term (current) drug therapy: Secondary | ICD-10-CM | POA: Insufficient documentation

## 2021-06-25 DIAGNOSIS — Z794 Long term (current) use of insulin: Secondary | ICD-10-CM | POA: Diagnosis not present

## 2021-06-25 LAB — CBC
HCT: 40.1 % (ref 36.0–46.0)
Hemoglobin: 12.4 g/dL (ref 12.0–15.0)
MCH: 24 pg — ABNORMAL LOW (ref 26.0–34.0)
MCHC: 30.9 g/dL (ref 30.0–36.0)
MCV: 77.7 fL — ABNORMAL LOW (ref 80.0–100.0)
Platelets: 393 10*3/uL (ref 150–400)
RBC: 5.16 MIL/uL — ABNORMAL HIGH (ref 3.87–5.11)
RDW: 14.7 % (ref 11.5–15.5)
WBC: 11.7 10*3/uL — ABNORMAL HIGH (ref 4.0–10.5)
nRBC: 0 % (ref 0.0–0.2)

## 2021-06-25 LAB — D-DIMER, QUANTITATIVE: D-Dimer, Quant: 0.87 ug/mL-FEU — ABNORMAL HIGH (ref 0.00–0.50)

## 2021-06-25 LAB — BASIC METABOLIC PANEL
Anion gap: 10 (ref 5–15)
BUN: 11 mg/dL (ref 6–20)
CO2: 29 mmol/L (ref 22–32)
Calcium: 9.1 mg/dL (ref 8.9–10.3)
Chloride: 96 mmol/L — ABNORMAL LOW (ref 98–111)
Creatinine, Ser: 0.55 mg/dL (ref 0.44–1.00)
GFR, Estimated: >60 mL/min (ref >60)
Glucose, Bld: 404 mg/dL — ABNORMAL HIGH (ref 70–99)
Potassium: 3.6 mmol/L (ref 3.5–5.1)
Sodium: 135 mmol/L (ref 135–145)

## 2021-06-25 LAB — BRAIN NATRIURETIC PEPTIDE
B Natriuretic Peptide: 9.7 pg/mL (ref 0.0–100.0)
B Natriuretic Peptide: 8.4 pg/mL (ref 0.0–100.0)

## 2021-06-25 LAB — TROPONIN I (HIGH SENSITIVITY): Troponin I (High Sensitivity): 7 ng/L (ref <18)

## 2021-06-25 MED ORDER — SODIUM CHLORIDE 0.9 % IV BOLUS
1000.0000 mL | Freq: Once | INTRAVENOUS | Status: AC
  Administered 2021-06-25: 1000 mL via INTRAVENOUS

## 2021-06-25 NOTE — ED Triage Notes (Signed)
Pt reports that she was brought over by [facility] after trying to be seen by them for bilat leg pain. She states that they did blood work and VSS and they told her that her heart rate, B/P and CBG were all elevated. VSS here. She did have an elevated D dimer at [facility] also. Her CBG was 523 over there also.

## 2021-06-25 NOTE — ED Provider Notes (Signed)
Emergency Medicine Provider Triage Evaluation Note  Cynthia Howe , a 55 y.o. female  was evaluated in triage.  Pt complains of bilateral lower extremity swelling x1 month.  Was evaluated in the walk-in clinic earlier today, noted to be hyperglycemic with blood glucose of 523.  She was hypertensive with blood pressure of 159/113 and tachycardic with pulse of 114.  Initial blood work over at the walk-in clinic showed elevated D-dimer.  She denies any chest pain, shortness of breath, dizziness or lightheadedness.  No history of blood clots.  Review of Systems  Positive: Bilateral lower extremity pain, swelling Negative: Fevers, chest pain, shortness of breath  Physical Exam  BP 121/83 (BP Location: Right Arm)   Pulse 90   Temp 97.8 F (36.6 C) (Oral)   Resp 20   Ht 5\' 5"  (1.651 m)   Wt 103 kg   SpO2 100%   BMI 37.77 kg/m  Gen:   Awake, no distress presents in a wheelchair Resp:  Normal effort no respiratory distress MSK:   Moves extremities without difficulty, no warmth redness or pitting edema.  Positive Homans' sign bilaterally Other:    Medical Decision Making  Medically screening exam initiated at 4:28 PM.  Appropriate orders placed.  Cynthia Howe was informed that the remainder of the evaluation will be completed by another provider, this initial triage assessment does not replace that evaluation, and the importance of remaining in the ED until their evaluation is complete.  Bilateral lower extremity swelling with elevated D-dimer, patient tachycardic and hypertensive.  Will order blood work and bilateral lower extremity ultrasounds to rule out DVT.  We will also order CT angio of the chest to rule out PE due to elevated D-dimer and heart rate   Vedia Pereyra 06/25/21 1630    13/07/22, MD 06/25/21 1851

## 2021-06-25 NOTE — ED Provider Notes (Signed)
[facility]  ____________________________________________   Event Date/Time   First MD Initiated Contact with Patient 06/25/21 1743     (approximate)  I have reviewed the triage vital signs and the nursing notes.   HISTORY  Chief Complaint Hyperglycemia    HPI Cynthia Howe is a 55 y.o. female with past medical history of diabetes, asthma, hypertension, sleep apnea who presents with bilateral lower extremity pain. Onset was about 1 month ago. Pain described as shooting over the entire lower extremities. Occasional burning/numbness. Mild swelling noted. She denies any injury. Denies fevers, chills, chest pain or dyspnea.  Has not had this before.  Patient takes long-acting insulin for her diabetes.  Has not checked her blood sugar this week.  She did not take her insulin this morning.  Denies nausea, vomiting abdominal pain.  Patient went to [facility] clinic today for evaluation of her leg pain and was noted to be hyperglycemic so she was referred to the emergency department.         Past Medical History:  Diagnosis Date   Asthma    Diabetes mellitus without complication (HCC)    Hypertension    Sleep apnea     Patient Active Problem List   Diagnosis Date Noted   Acute respiratory failure with hypoxia (HCC) 02/01/2015   Headache 02/01/2015   Constipation 02/01/2015   Asthma with exacerbation 01/30/2015   Diabetes type 2, controlled (HCC) 01/30/2015   HTN (hypertension) 01/30/2015   HLD (hyperlipidemia) 01/30/2015   OSA on CPAP 01/30/2015   Asthma with acute exacerbation 01/30/2015    Past Surgical History:  Procedure Laterality Date   ABDOMINAL HYSTERECTOMY     KNEE ARTHROSCOPY     OOPHORECTOMY     PATELLA FRACTURE SURGERY      Prior to Admission medications   Medication Sig Start Date End Date Taking? Authorizing Provider  albuterol (PROVENTIL HFA;VENTOLIN HFA) 108 (90 BASE) MCG/ACT inhaler Inhale 1 puff into the lungs every 6 (six)  hours as needed for wheezing or shortness of breath.    [provider]  amLODipine (NORVASC) 10 MG tablet Take 10 mg by mouth daily.    [provider]  aspirin EC 81 MG tablet Take 81 mg by mouth daily.    [provider]  atorvastatin (LIPITOR) 40 MG tablet Take 40 mg by mouth daily.    [provider]  budesonide-formoterol (SYMBICORT) 160-4.5 MCG/ACT inhaler Inhale 2 puffs into the lungs 2 (two) times daily.    [provider]  cyclobenzaprine (FLEXERIL) 5 MG tablet Take 1 tablet (5 mg total) by mouth 3 (three) times daily as needed for muscle spasms. 01/01/18   Menshew, Charlesetta Ivory, PA-C  ibuprofen (ADVIL,MOTRIN) 600 MG tablet Take 1 tablet (600 mg total) by mouth every 8 (eight) hours as needed. 01/01/18   Menshew, Charlesetta Ivory, PA-C  insulin aspart (NOVOLOG) 100 UNIT/ML injection Before each meal 3 times a day, 140-199 - 2 units, 200-250 - 4 units, 251-299 - 6 units,  300-349 - 8 units,  350 or above 10 units. Dispense syringes and needles as needed, Ok to switch to PEN if approved. 02/01/15   Katharina Caper, MD  insulin aspart (NOVOLOG) 100 UNIT/ML injection Before each meal 3 times a day, 140-199 - 2 units, 200-250 - 4 units, 251-299 - 6 units,  300-349 - 8 units,  350 or above 10 units. Dispense syringes and needles as needed, Ok to switch to PEN if approved. 02/02/15  Katharina Caper, MD  insulin glargine (LANTUS) 100 UNIT/ML injection Inject 30 Units into the skin daily.     [provider]  Insulin Pen Needle (BD PEN NEEDLE NANO U/F) 32G X 4 MM MISC 1 Units by Does not apply route as needed. 07/06/15   Rockne Menghini, MD  ipratropium-albuterol (DUONEB) 0.5-2.5 (3) MG/3ML SOLN Take 3 mLs by nebulization every 8 (eight) hours as needed (for shortness of breath).     [provider]  senna-docusate (SENOKOT-S) 8.6-50 MG per tablet Take 1 tablet by mouth at bedtime as needed for mild constipation. 02/01/15   Katharina Caper,  MD    Allergies Ciprofloxacin hcl and Septra [sulfamethoxazole-trimethoprim]  Family History  Problem Relation Age of Onset   Diabetes Mother    Cancer Mother    Hypertension Father    Diabetes Father    Cancer Father    Heart disease Father    Heart disease Maternal Uncle    Heart disease Paternal Grandfather    Cancer Cousin     Social History Social History   Tobacco Use   Smoking status: Never   Smokeless tobacco: Never  Substance Use Topics   Alcohol use: No    Alcohol/week: 0.0 standard drinks   Drug use: No    Review of Systems   Review of Systems  Constitutional:  Negative for chills and fever.  Respiratory:  Negative for shortness of breath.   Cardiovascular:  Positive for leg swelling. Negative for chest pain.  Gastrointestinal:  Negative for abdominal pain, nausea and vomiting.  Endocrine: Negative for polydipsia and polyuria.  Musculoskeletal:  Positive for arthralgias. Negative for back pain.  All other systems reviewed and are negative.  Physical Exam Updated Vital Signs BP 130/82 (BP Location: Left Arm)   Pulse 92   Temp 97.8 F (36.6 C) (Oral)   Resp (!) 22   Ht 5\' 5"  (1.651 m)   Wt 103 kg   SpO2 100%   BMI 37.77 kg/m   Physical Exam Vitals and nursing note reviewed.  Constitutional:      General: She is not in acute distress.    Appearance: Normal appearance.  HENT:     Head: Normocephalic and atraumatic.  Eyes:     General: No scleral icterus.    Conjunctiva/sclera: Conjunctivae normal.  Pulmonary:     Effort: Pulmonary effort is normal. No respiratory distress.     Breath sounds: No stridor.  Musculoskeletal:        General: No deformity or signs of injury.     Cervical back: Normal range of motion.     Comments: Bilateral lower extremities are warm and well-perfused without obvious swelling, pitting edema or skin changes 2+ DP pulses bilaterally, mildly tender to palpation throughout the lower calves, compartments are soft   Skin:    General: Skin is dry.     Coloration: Skin is not jaundiced or pale.  Neurological:     General: No focal deficit present.     Mental Status: She is alert and oriented to person, place, and time. Mental status is at baseline.  Psychiatric:        Mood and Affect: Mood normal.        Behavior: Behavior normal.     LABS (all labs ordered are listed, but only abnormal results are displayed)  Labs Reviewed  CBC - Abnormal; Notable for the following components:      Result Value   WBC 11.7 (*)  RBC 5.16 (*)    MCV 77.7 (*)    MCH 24.0 (*)    All other components within normal limits  BASIC METABOLIC PANEL - Abnormal; Notable for the following components:   Chloride 96 (*)    Glucose, Bld 404 (*)    All other components within normal limits  BRAIN NATRIURETIC PEPTIDE  TROPONIN I (HIGH SENSITIVITY)   ____________________________________________  EKG  N/a ____________________________________________  RADIOLOGY Ky Barban, personally viewed and evaluated these images (plain radiographs) as part of my medical decision making, as well as reviewing the written report by the radiologist.  ED MD interpretation: I reviewed the DVT ultrasound of the bilateral lower extremities which is negative for DVT    ____________________________________________   PROCEDURES  Procedure(s) performed (including Critical Care):  Procedures   ____________________________________________   INITIAL IMPRESSION / ASSESSMENT AND PLAN / ED COURSE     Patient is a 55 year old female who presents with leg pain.  This is been going on for about a month and she was seen in [place] clinic today and referred to the ED because her blood sugar was in the 500s.  They did send a D-dimer for which I am not sure exactly why.  She has normal vital signs here and on exam she has no obvious swelling, infection or ischemia of her lower extremities.  She has good pulses with soft  compartments and there is no pitting edema.  DVT study of the bilateral lower extremities was obtained from triage which is negative for DVT.  She has absolutely no symptoms of pulmonary embolism including no chest pain dyspnea and she is not tachycardic or hypoxic here so I do not feel that she needs evaluation for PE.  Her lab work does show a elevated blood sugar of 400 with normal bicarb and no anion gap to suggest DKA.  Patient is asymptomatic with normal mental status.  Suspect that her hyperglycemia is in the setting of not taking insulin today.  She was given a liter of fluids and advised to take her insulin when she gets home.  I suspect that her leg pain is likely related to diabetic neuropathy.  I did advise that she follow-up with her primary care provider.  We discussed gabapentin however patient tells me she already has this and does not want to be taking it frequently.  Recommended a short course of NSAIDs and Tylenol      ____________________________________________   FINAL CLINICAL IMPRESSION(S) / ED DIAGNOSES  Final diagnoses:  Hyperglycemia  Pain in both lower extremities     ED Discharge Orders     None        Note:  This document was prepared using Dragon voice recognition software and may include unintentional dictation errors.    Georga Hacking, MD 06/25/21 2157

## 2021-06-25 NOTE — ED Notes (Signed)
When going into the room  pt began to complain about how no one came to her call bell light in time for her to ask a question- when telling the pt that there are only two nurses back here and one is with a critical pt -this pt began to grab my badge and ask for my name and the other nurses name to make a complaint about the wait times in the ER.  Pt was rude demanding and forcefully rude. Pt discharged appropriately.

## 2021-06-25 NOTE — Discharge Instructions (Addendum)
Your blood sugar was elevated today, please make sure you are taking your insulin as prescribed.  Your pain may be related to neuropathy.  You can take Tylenol and Motrin for the pain.  I also recommend you take the gabapentin as this may help with this type of pain.

## 2023-10-06 ENCOUNTER — Ambulatory Visit
Admission: EM | Admit: 2023-10-06 | Discharge: 2023-10-06 | Disposition: A | Discharge status: Home / Self Care | Payer: Managed Care, Other (non HMO) | Attending: Emergency Medicine | Admitting: Emergency Medicine

## 2023-10-06 DIAGNOSIS — R310 Gross hematuria: Secondary | ICD-10-CM | POA: Diagnosis not present

## 2023-10-06 DIAGNOSIS — R300 Dysuria: Secondary | ICD-10-CM | POA: Insufficient documentation

## 2023-10-06 LAB — POCT URINALYSIS DIP (MANUAL ENTRY)
Bilirubin, UA: NEGATIVE
Glucose, UA: NEGATIVE mg/dL
Ketones, POC UA: NEGATIVE mg/dL
Nitrite, UA: NEGATIVE
Protein Ur, POC: >=300 mg/dL — AB
Spec Grav, UA: 1.01 (ref 1.010–1.025)
Urobilinogen, UA: 0.2 U/dL
pH, UA: 6 (ref 5.0–8.0)

## 2023-10-06 MED ORDER — FLUCONAZOLE 150 MG PO TABS: 150.0000 mg | ORAL_TABLET | Freq: Once | ORAL | 0 refills | Status: AC

## 2023-10-06 MED ORDER — CEPHALEXIN 500 MG PO CAPS: 500.0000 mg | ORAL_CAPSULE | Freq: Three times a day (TID) | ORAL | 0 refills | Status: AC

## 2023-10-06 NOTE — Discharge Instructions (Addendum)
 Take the antibiotic as directed.  The urine culture is pending.  We will call you if it shows the need to change or discontinue your antibiotic.    Take the Diflucan as directed.    Follow-up with your primary care provider for a recheck of your urine.

## 2023-10-06 NOTE — ED Triage Notes (Signed)
 Patient to Urgent Care with complaints of urinary frequency/ dysuria/ hematuria. Denies any known fevers.   Reports symptoms started yesterday.

## 2023-10-06 NOTE — ED Provider Notes (Signed)
 Renaldo Fiddler    CSN: [id number] Arrival date & time: 10/06/23  1657      History   Chief Complaint Chief Complaint  Patient presents with   Urinary Frequency   Hematuria    HPI Cynthia Howe is a 58 y.o. female.  Patient presents with 1 day history of dysuria, urinary frequency, gross hematuria.  No fever, abdominal pain, flank pain.  No treatments at home.  Her medical history includes diabetes.  The history is provided by the patient and medical records.    Past Medical History:  Diagnosis Date   Asthma    Diabetes mellitus without complication (HCC)    Hypertension    Sleep apnea     Patient Active Problem List   Diagnosis Date Noted   Acute respiratory failure with hypoxia (HCC) 02/01/2015   Headache 02/01/2015   Constipation 02/01/2015   Asthma with exacerbation 01/30/2015   Diabetes type 2, controlled (HCC) 01/30/2015   HTN (hypertension) 01/30/2015   HLD (hyperlipidemia) 01/30/2015   OSA on CPAP 01/30/2015   Asthma with acute exacerbation 01/30/2015    Past Surgical History:  Procedure Laterality Date   ABDOMINAL HYSTERECTOMY     KNEE ARTHROSCOPY     OOPHORECTOMY     PATELLA FRACTURE SURGERY      OB History   No obstetric history on file.      Home Medications    Prior to Admission medications   Medication Sig Start Date End Date Taking? Authorizing Provider  cephALEXin (KEFLEX) 500 MG capsule Take 1 capsule (500 mg total) by mouth 3 (three) times daily for 7 days. 10/06/23 10/13/23 Yes Mickie Bail, NP  fluconazole (DIFLUCAN) 150 MG tablet Take 1 tablet (150 mg total) by mouth once for 1 dose. 10/06/23 10/06/23 Yes Mickie Bail, NP  albuterol (PROVENTIL HFA;VENTOLIN HFA) 108 (90 BASE) MCG/ACT inhaler Inhale 1 puff into the lungs every 6 (six) hours as needed for wheezing or shortness of breath.    [provider]  amLODipine (NORVASC) 10 MG tablet Take 10 mg by mouth daily.    [provider]  aspirin EC 81 MG  tablet Take 81 mg by mouth daily.    [provider]  atorvastatin (LIPITOR) 40 MG tablet Take 40 mg by mouth daily.    [provider]  budesonide-formoterol (SYMBICORT) 160-4.5 MCG/ACT inhaler Inhale 2 puffs into the lungs 2 (two) times daily.    [provider]  cyclobenzaprine (FLEXERIL) 5 MG tablet Take 1 tablet (5 mg total) by mouth 3 (three) times daily as needed for muscle spasms. 01/01/18   Menshew, Charlesetta Ivory, PA-C  ibuprofen (ADVIL,MOTRIN) 600 MG tablet Take 1 tablet (600 mg total) by mouth every 8 (eight) hours as needed. 01/01/18   Menshew, Charlesetta Ivory, PA-C  insulin aspart (NOVOLOG) 100 UNIT/ML injection Before each meal 3 times a day, 140-199 - 2 units, 200-250 - 4 units, 251-299 - 6 units,  300-349 - 8 units,  350 or above 10 units. Dispense syringes and needles as needed, Ok to switch to PEN if approved. 02/01/15   Katharina Caper, MD  insulin aspart (NOVOLOG) 100 UNIT/ML injection Before each meal 3 times a day, 140-199 - 2 units, 200-250 - 4 units, 251-299 - 6 units,  300-349 - 8 units,  350 or above 10 units. Dispense syringes and needles as needed, Ok to switch to PEN if approved. 02/02/15   Katharina Caper, MD  insulin glargine (LANTUS) 100 UNIT/ML  injection Inject 30 Units into the skin daily.     [provider]  Insulin Pen Needle (BD PEN NEEDLE NANO U/F) 32G X 4 MM MISC 1 Units by Does not apply route as needed. 07/06/15   Rockne Menghini, MD  ipratropium-albuterol (DUONEB) 0.5-2.5 (3) MG/3ML SOLN Take 3 mLs by nebulization every 8 (eight) hours as needed (for shortness of breath).     [provider]  senna-docusate (SENOKOT-S) 8.6-50 MG per tablet Take 1 tablet by mouth at bedtime as needed for mild constipation. 02/01/15   Katharina Caper, MD    Family History Family History  Problem Relation Age of Onset   Diabetes Mother    Cancer Mother    Hypertension Father    Diabetes Father    Cancer Father    Heart disease  Father    Heart disease Maternal Uncle    Heart disease Paternal Grandfather    Cancer Cousin     Social History Social History   Tobacco Use   Smoking status: Never   Smokeless tobacco: Never  Substance Use Topics   Alcohol use: No    Alcohol/week: 0.0 standard drinks of alcohol   Drug use: No     Allergies   Ciprofloxacin hcl and Septra [sulfamethoxazole-trimethoprim]   Review of Systems Review of Systems  Constitutional:  Negative for chills and fever.  Gastrointestinal:  Negative for abdominal pain, nausea and vomiting.  Genitourinary:  Positive for dysuria, frequency and hematuria. Negative for flank pain.     Physical Exam Triage Vital Signs ED Triage Vitals  Encounter Vitals Group     BP 10/06/23 1731 138/80     Systolic BP Percentile --      Diastolic BP Percentile --      Pulse Rate 10/06/23 1731 77     Resp 10/06/23 1731 18     Temp 10/06/23 1731 98 F (36.7 C)     Temp src --      SpO2 10/06/23 1731 98 %     Weight --      Height --      Head Circumference --      Peak Flow --      Pain Score 10/06/23 1725 0     Pain Loc --      Pain Education --      Exclude from Growth Chart --    No data found.  Updated Vital Signs BP 138/80   Pulse 77   Temp 98 F (36.7 C)   Resp 18   SpO2 98%   Visual Acuity Right Eye Distance:   Left Eye Distance:   Bilateral Distance:    Right Eye Near:   Left Eye Near:    Bilateral Near:     Physical Exam Constitutional:      General: She is not in acute distress. HENT:     Mouth/Throat:     Mouth: Mucous membranes are moist.  Cardiovascular:     Rate and Rhythm: Normal rate and regular rhythm.  Pulmonary:     Effort: Pulmonary effort is normal. No respiratory distress.  Abdominal:     General: Bowel sounds are normal.     Palpations: Abdomen is soft.     Tenderness: There is no abdominal tenderness. There is no right CVA tenderness, left CVA tenderness, guarding or rebound.  Neurological:      Mental Status: She is alert.      UC Treatments / Results  Labs (all labs ordered  are listed, but only abnormal results are displayed) Labs Reviewed  POCT URINALYSIS DIP (MANUAL ENTRY) - Abnormal; Notable for the following components:      Result Value   Clarity, UA cloudy (*)    Blood, UA large (*)    Protein Ur, POC >=300 (*)    Leukocytes, UA Moderate (2+) (*)    All other components within normal limits  URINE CULTURE    EKG   Radiology No results found.  Procedures Procedures (including critical care time)  Medications Ordered in UC Medications - No data to display  Initial Impression / Assessment and Plan / UC Course  I have reviewed the triage vital signs and the nursing notes.  Pertinent labs & imaging results that were available during my care of the patient were reviewed by me and considered in my medical decision making (see chart for details).    Dysuria, gross hematuria.  Treating with Keflex. Urine culture pending. Discussed with patient that we will call her if the urine culture shows the need to change or discontinue the antibiotic. One Diflucan prescribed per patient request; she reports history of yeast infections when taking an antibiotic.  Instructed her to follow-up with her PCP in 1 to 2 weeks for recheck of her urine.  Education provided on dysuria and hematuria.  Patient agrees to plan of care.     Final Clinical Impressions(s) / UC Diagnoses   Final diagnoses:  Dysuria  Gross hematuria     Discharge Instructions      Take the antibiotic as directed.  The urine culture is pending.  We will call you if it shows the need to change or discontinue your antibiotic.    Take the Diflucan as directed.    Follow-up with your primary care provider for a recheck of your urine.     ED Prescriptions     Medication Sig Dispense Auth. Provider   cephALEXin (KEFLEX) 500 MG capsule Take 1 capsule (500 mg total) by mouth 3 (three) times daily for 7  days. 21 capsule Mickie Bail, NP   fluconazole (DIFLUCAN) 150 MG tablet Take 1 tablet (150 mg total) by mouth once for 1 dose. 1 tablet Mickie Bail, NP      PDMP not reviewed this encounter.   Mickie Bail, NP 10/06/23 1750

## 2023-10-07 LAB — URINE CULTURE: Culture: 20000 — AB

## 2023-10-08 ENCOUNTER — Telehealth: Payer: Self-pay | Admitting: Emergency Medicine

## 2023-10-08 MED ORDER — FOSFOMYCIN TROMETHAMINE 3 G PO PACK: 3.0000 g | PACK | Freq: Once | ORAL | 0 refills | Status: AC

## 2023-10-08 NOTE — Telephone Encounter (Signed)
 Patient sent home on Keflex for treatment of presumed urinary tract infection.  Urine culture revealed Corynebacterium.  Clinical staff advised that if patient has not had relief of symptoms, she should take fosfomycin as prescribed today.  If symptoms have resolved, fosfomycin is not needed.
